# Patient Record
Sex: Male | Born: 1965
Health system: Southern US, Community
[De-identification: ages and names within clinical notes are randomized; demographics above are authoritative.]

## PROBLEM LIST (undated history)

## (undated) DIAGNOSIS — C61 Malignant neoplasm of prostate: Secondary | ICD-10-CM

## (undated) DIAGNOSIS — J4540 Moderate persistent asthma, uncomplicated: Secondary | ICD-10-CM

## (undated) HISTORY — PX: NO PAST SURGERIES: SHX2092

## (undated) HISTORY — PX: PROSTATE BIOPSY: SHX241

---

## 2005-02-12 ENCOUNTER — Emergency Department (HOSPITAL_COMMUNITY): Admission: EM | Admit: 2005-02-12 | Discharge: 2005-02-12 | Payer: Self-pay | Admitting: Emergency Medicine

## 2005-06-10 ENCOUNTER — Encounter: Admission: RE | Admit: 2005-06-10 | Discharge: 2005-06-10 | Payer: Self-pay | Admitting: Family Medicine

## 2007-02-01 IMAGING — CT CT ABDOMEN W/ CM
1 of 2 series · 15 of 32 positions shown, 19 images · IV contrast (G+ & 100 ML OMNI 300)
Comparison: None.

CLINICAL DATA: Right flank pain radiating down leg.
 ABDOMEN AND PELVIS CT WITH CONTRAST:
TECHNIQUE: Multidetector CT imaging of the abdomen and pelvis was performed following the standard protocol during bolus administration of intravenous contrast.
 Contrast:  100 cc of Omnipaque 300.

[Series 2: routine abdomen · axial · 0.67mm/px · z∈[-412,-12]mm · 15 of 87 slices shown, 19 images]
[im 4/87  soft-tissue]
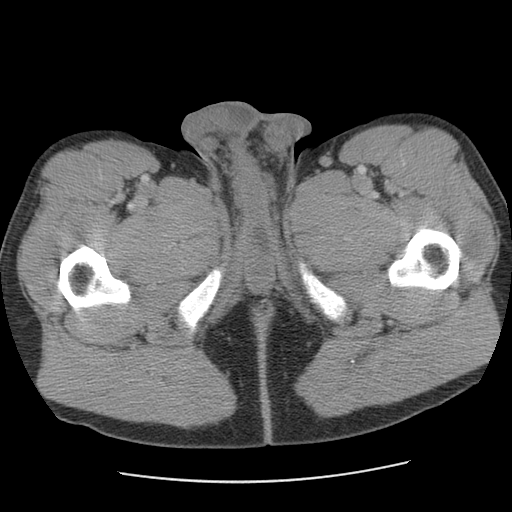
[im 4/87  bone]
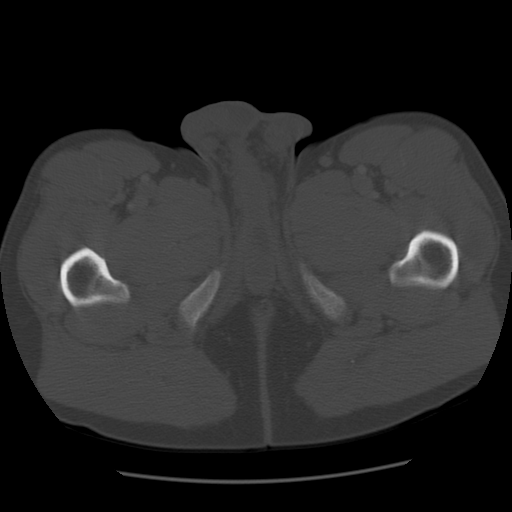
[im 12/87  soft-tissue]
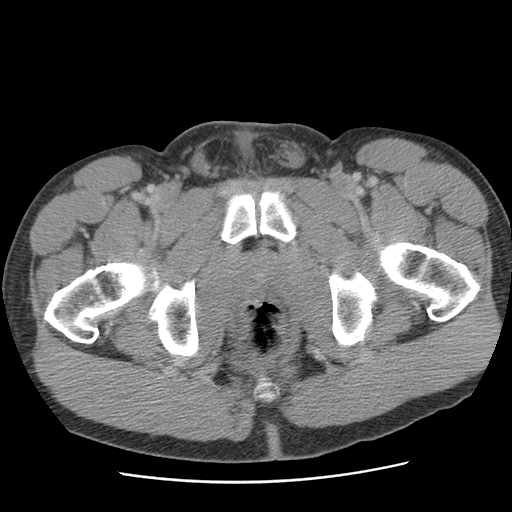
[im 19/87  soft-tissue]
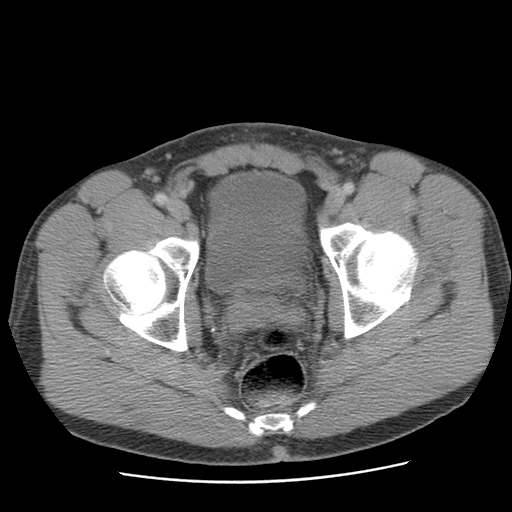
[im 23/87  soft-tissue]
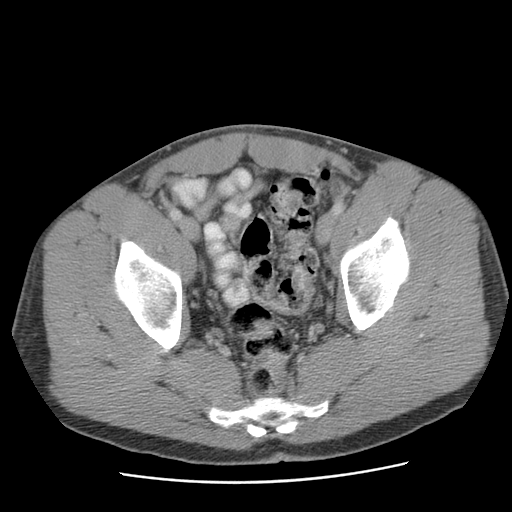
[im 30/87  soft-tissue]
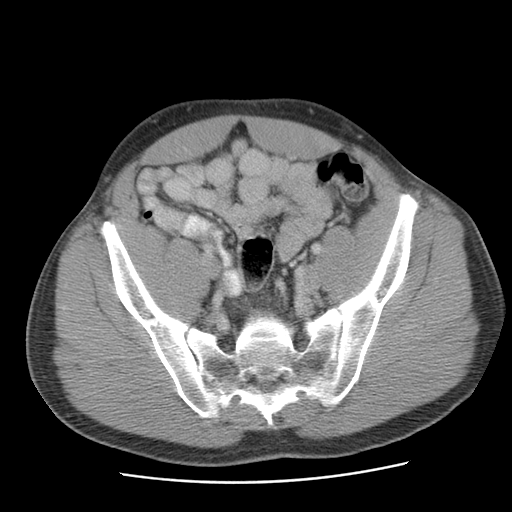
[im 38/87  soft-tissue]
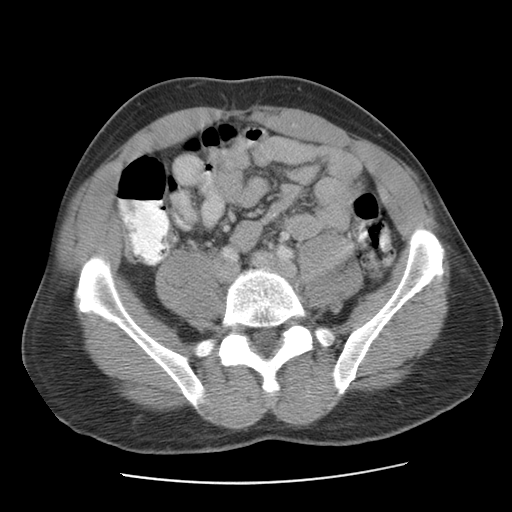
[im 45/87  soft-tissue]
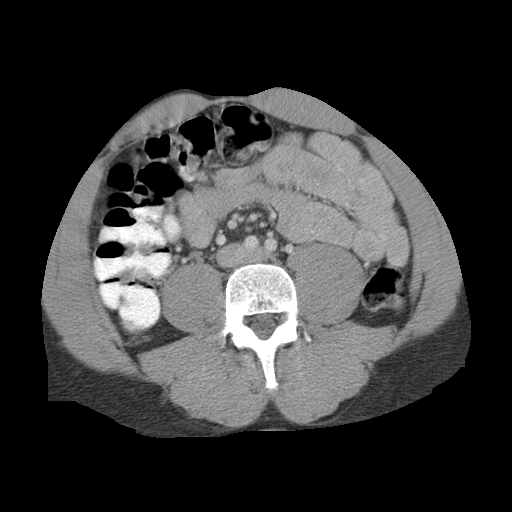
[im 49/87  soft-tissue]
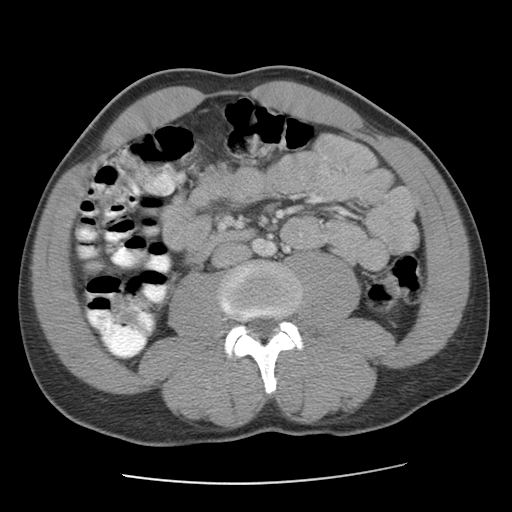
[im 57/87  soft-tissue]
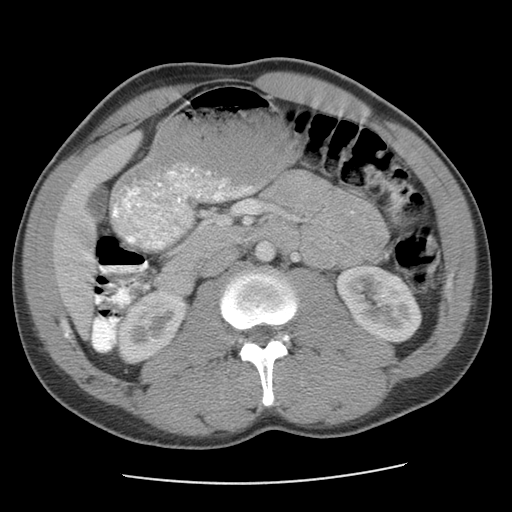
[im 57/87  bone]
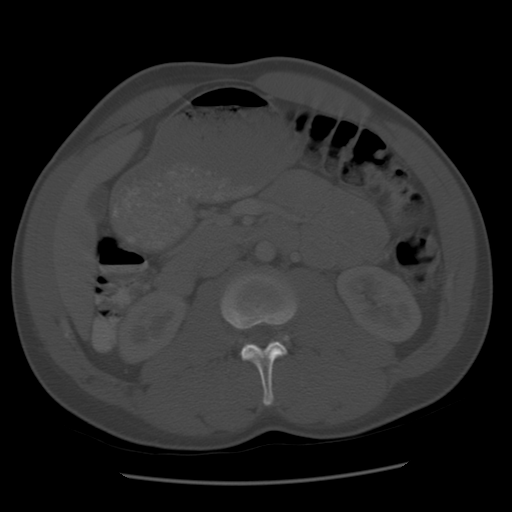
[im 64/87  soft-tissue]
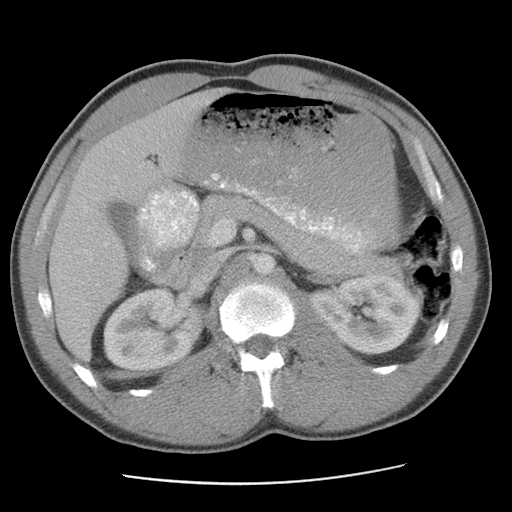
[im 68/87  soft-tissue]
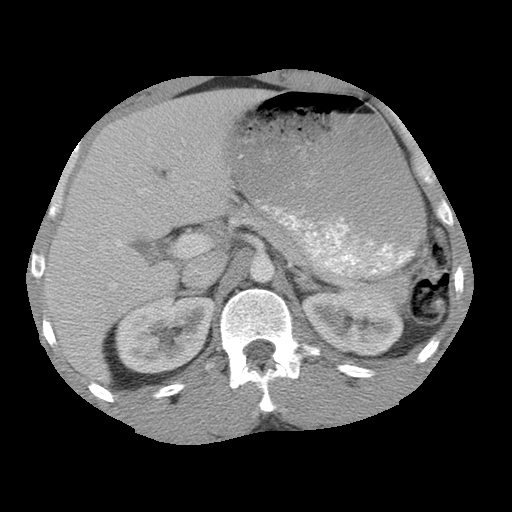
[im 72/87  lung]
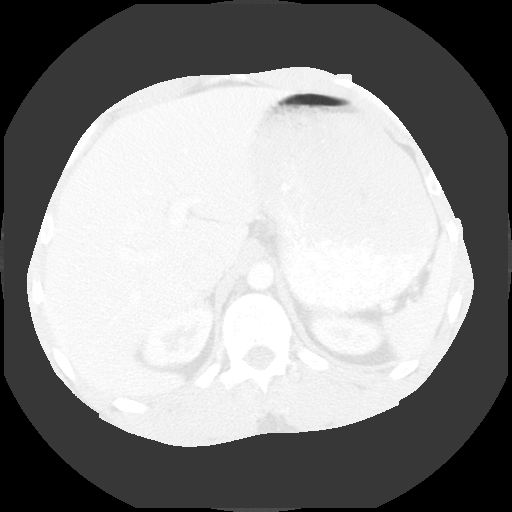
[im 75/87  soft-tissue]
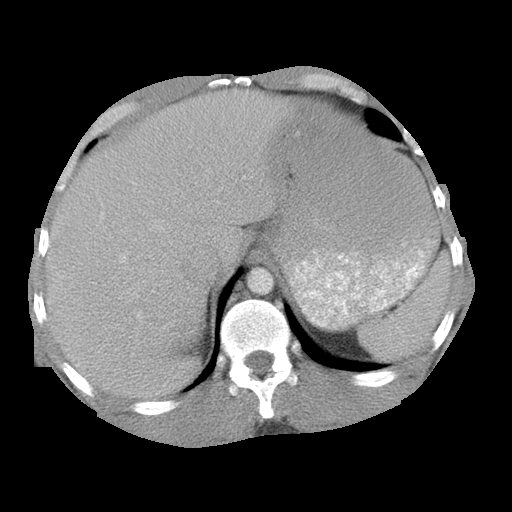
[im 75/87  lung]
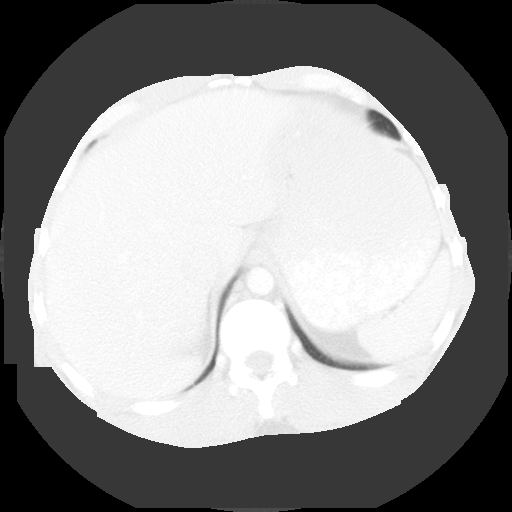
[im 79/87  lung]
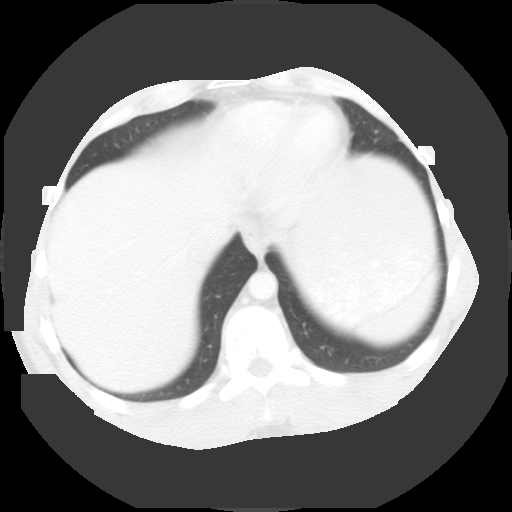
[im 83/87  soft-tissue]
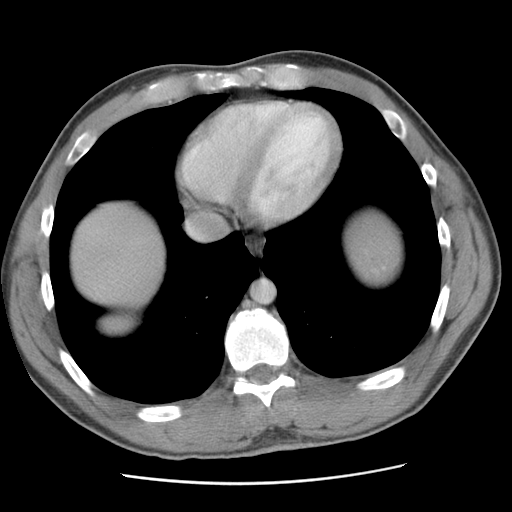
[im 83/87  lung]
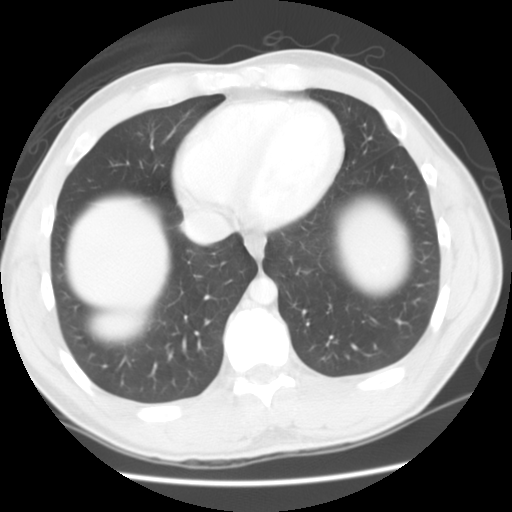

[15 of 32 positions shown; findings below may reference images not displayed]

CT ABDOMEN WITH CONTRAST:
 Lung bases are clear.  No focal lesions of the liver or spleen.  Pancreas and adrenal glands normal.  No calcified gallstones or biliary obstruction.  No adenopathy or ascites.  Prominent diaphragmatic crura simulate adenopathy of the retroperitoneum.  Early and delayed images of the kidneys show no stones, masses, or obstruction.
IMPRESSION: No acute or significant findings. 
 CT PELVIS WITH CONTRAST:
 No focal masses, adenopathy, or fluid collections.  I do not see the appendix with certainty but there are no inflammatory changes in the region of the appendix to suggest appendicitis.  Prostate within normal limits in size although there are some central prostatic calcifications.  Pelvic sidewalls and presacral space are normal.
IMPRESSION: 1.  No acute or significant findings.
 2.  The appendix is not visualized with certainty, but there are no inflammatory changes to suggest appendicitis.

## 2011-04-06 ENCOUNTER — Encounter: Payer: Self-pay | Admitting: Internal Medicine

## 2011-04-06 DIAGNOSIS — E663 Overweight: Secondary | ICD-10-CM | POA: Insufficient documentation

## 2011-04-06 DIAGNOSIS — J45909 Unspecified asthma, uncomplicated: Secondary | ICD-10-CM | POA: Insufficient documentation

## 2011-04-07 ENCOUNTER — Ambulatory Visit (INDEPENDENT_AMBULATORY_CARE_PROVIDER_SITE_OTHER): Payer: BC Managed Care – PPO | Admitting: Internal Medicine

## 2011-04-07 ENCOUNTER — Encounter: Payer: Self-pay | Admitting: Internal Medicine

## 2011-04-07 DIAGNOSIS — J45909 Unspecified asthma, uncomplicated: Secondary | ICD-10-CM

## 2011-04-07 NOTE — Assessment & Plan Note (Addendum)
DDX of  difficult airways managment all start with A and  include Adherence, Ace Inhibitors, Acid Reflux, Active Sinus Disease, Alpha 1 Antitripsin deficiency, Anxiety masquerading as Airways dz,  ABPA,  allergy(esp in young), Aspiration (esp in elderly), Adverse effects of DPI,  Active smokers, plus two Bs  = Bronchiectasis and Beta blocker use..and one C= CHF  Adherence is always the initial "prime suspect" and is a multilayered concern that requires a "trust but verify" approach in every patient - starting with knowing how to use medications, especially inhalers, correctly, keeping up with refills and understanding the fundamental difference between maintenance and prns vs those medications only taken for a very short course and then stopped and not refilled.   Needs pft's on return and keep working on hfa  The proper method of use, as well as anticipated side effects, of this metered-dose inhaler are discussed and demonstrated to the patient.  Improved to 75% p repeated teaching Allergies also a concern but avoidance of specific triggers is the best approach here   Contingencies discussed in full including contacting this office immediately if not controlling the symptoms using the rule of two's.

## 2011-04-07 NOTE — Patient Instructions (Signed)
Work on inhaler technique:  relax and gently blow all the way out then take a nice smooth deep breath back in, triggering the inhaler at same time you start breathing in.  Hold for up to 5 seconds if you can.  Rinse and gargle with water when done   If your mouth or throat starts to bother you,   I suggest you time the inhaler to your dental care and after using the inhaler(s) brush teeth and tongue with a baking soda containing toothpaste and when you rinse this out, gargle with it first to see if this helps your mouth and throat.     Symbicort 160 Take 2 puffs first thing in am and then another 2 puffs about 12 hours later.    Only use proaire if needed - the goal is to need it less than twice weekly be able to do anything you want (like 20/20 wearing glasses)  Please schedule a follow up office visit in 4 weeks, sooner if needed pft's

## 2011-04-07 NOTE — Progress Notes (Signed)
  Subjective:    Patient ID: Jordan [name], male    DOB: February 18, 1966, 45 y.o.   MRN: [id number]  HPI  50 yobm  Never smoker with asthma entire life referred by Dr Nehemiah Settle for pulmonary clinic for evaluation 04/07/2011  04/07/2011 Initial pulmonary office eval cc chronic/ recurrent sob when not using symbicort,  But even on symbicort needs proaire when mowing grass. Never while sleeping.  No cough, no previous allergy eval.  No er or hospitalizaitons.  Pt denies any significant sore throat, dysphagia, itching, sneezing,  nasal congestion or excess/ purulent secretions,  fever, chills, sweats, unintended wt loss, pleuritic or exertional cp, hempoptysis, orthopnea pnd or leg swelling.    Also denies any obvious fluctuation of symptoms with weather or environmental changes or other aggravating or alleviating factors other than mowing grass. 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  HENT: Negative for congestion, sore throat, rhinorrhea, sneezing, trouble swallowing, dental problem, voice change and postnasal drip.   Eyes: Negative for visual disturbance.  Respiratory: Positive for shortness of breath. Negative for cough and choking.   Cardiovascular: Negative for chest pain and leg swelling.  Gastrointestinal: Negative for nausea, vomiting and abdominal pain.  Genitourinary: Negative for difficulty urinating.  Musculoskeletal: Negative for arthralgias.  Skin: Negative for rash.  Psychiatric/Behavioral: Negative for behavioral problems and confusion.       Objective:   Physical Exam amb bm nad  HEENT: nl dentition, turbinates, and orophanx. Nl external ear canals without cough reflex   NECK :  without JVD/Nodes/TM/ nl carotid upstrokes bilaterally   LUNGS: no acc muscle use, clear to A and P bilaterally without cough on insp or exp  maneuvers   CV:  RRR  no s3 or murmur or increase in P2, no edema   ABD:  soft and nontender with nl excursion in the supine position. No bruits or organomegaly, bowel sounds nl  MS:  warm without deformities, calf tenderness, cyanosis or clubbing  SKIN: warm and dry without lesions    NEURO:  alert, approp, no deficits          Assessment & Plan:

## 2011-05-01 ENCOUNTER — Encounter: Payer: Self-pay | Admitting: Internal Medicine

## 2011-05-05 ENCOUNTER — Ambulatory Visit (INDEPENDENT_AMBULATORY_CARE_PROVIDER_SITE_OTHER): Payer: BC Managed Care – PPO | Admitting: Internal Medicine

## 2011-05-05 ENCOUNTER — Other Ambulatory Visit (INDEPENDENT_AMBULATORY_CARE_PROVIDER_SITE_OTHER): Payer: BC Managed Care – PPO

## 2011-05-05 ENCOUNTER — Encounter: Payer: Self-pay | Admitting: Internal Medicine

## 2011-05-05 DIAGNOSIS — J45909 Unspecified asthma, uncomplicated: Secondary | ICD-10-CM

## 2011-05-05 LAB — CBC WITH DIFFERENTIAL/PLATELET
Basophils Absolute: 0 10*3/uL (ref 0.0–0.1)
Basophils Relative: 0.3 % (ref 0.0–3.0)
Eosinophils Relative: 1.9 % (ref 0.0–5.0)
MCV: 84.5 fl (ref 78.0–100.0)
Neutro Abs: 4.7 10*3/uL (ref 1.4–7.7)
RDW: 14.8 % — ABNORMAL HIGH (ref 11.5–14.6)

## 2011-05-05 LAB — PULMONARY FUNCTION TEST

## 2011-05-05 NOTE — Patient Instructions (Addendum)
Work on inhaler technique:  relax and gently blow all the way out then take a nice smooth deep breath back in, triggering the inhaler at same time you start breathing in.  Hold for up to 5 seconds if you can.  Rinse and gargle with water when done   If your mouth or throat starts to bother you,   I suggest you time the inhaler to your dental care and after using the inhaler(s) brush teeth and tongue with a baking soda containing toothpaste and when you rinse this out, gargle with it first to see if this helps your mouth and throat.     Continue Symbicort Take 2 puffs first thing in am and then another 2 puffs about 12 hours later.    If your breathing worsens or you need to use your rescue inhaler(Proaire) more than twice weekly or wake up more than twice a month with any respiratory symptoms or require more than two rescue inhalers per year, we need to see you right away because this means we're not controlling the underlying problem (inflammation) adequately.  Rescue inhalers do not control inflammation and overuse can lead to unnecessary and costly consequences.  They can make you feel better temporarily but eventually they will quit working effectively much as sleep aids lead to more insomnia if used regularly.   Please schedule a follow up visit in 3 months but call sooner if needed   We will call you with your blood results in meantime when available

## 2011-05-05 NOTE — Progress Notes (Signed)
PFT done today. 

## 2011-05-05 NOTE — Progress Notes (Signed)
  Subjective:    Patient ID: Jordan [name], male    DOB: 1966/09/07, 45 y.o.   MRN: [id number]  HPI  34 yobm  Never smoker with asthma entire life referred by Dr Nehemiah Settle for pulmonary clinic for evaluation 04/07/2011  04/07/2011 Initial pulmonary office eval cc chronic/ recurrent sob when not using symbicort,  But even on symbicort needs proaire when mowing grass. Never while sleeping.  No cough, no previous allergy eval.  No er or hospitalizaitons. rec work hfa and start symbicort 160 2 bid .  05/05/2011 ov/Mayela Bullard cc no cough or sob but not aerobically active. Pt denies any significant sore throat, dysphagia, itching, sneezing,  nasal congestion or excess/ purulent secretions,  fever, chills, sweats, unintended wt loss, pleuritic or exertional cp, hempoptysis, orthopnea pnd or leg swelling.    Also denies any obvious fluctuation of symptoms with weather or environmental changes or other aggravating or alleviating factors.  Sleeping ok without nocturnal  or early am exac of resp c/o's or need for noct saba.    Active Problems Severe Chronic Asthma     - PFT's 05/05/2011  FEV1  1.21 (33%) ratio 34 and 37% better p B2, DLC0 76%     - HFA 75% p coaching 05/05/2011    Review of Systems     Objective:   Physical Exam Pleasant amb bm nad  Wt 215  05/05/11 HEENT mild turbinate edema.  Oropharynx no thrush or excess pnd or cobblestoning.  No JVD or cervical adenopathy. Mild accessory muscle hypertrophy. Trachea midline, nl thryroid. Chest was hyperinflated by percussion with diminished breath sounds and moderate increased exp time without wheeze. Hoover sign positive at mid inspiration. Regular rate and rhythm without murmur gallop or rub or increase P2 or edema.  Abd: no hsm, nl excursion. Ext warm without cyanosis or clubbing.         Assessment & Plan:

## 2011-05-06 ENCOUNTER — Encounter: Payer: Self-pay | Admitting: Internal Medicine

## 2011-05-06 LAB — ALLERGY PROFILE REGION II-DC, DE, MD, ~~LOC~~, VA
Allergen, D pternoyssinus,d7: <0.35 kU/L (ref <0.35)
Cladosporium Herbarum: 4.83 kU/L — ABNORMAL HIGH (ref <0.35)
Cockroach: <0.35 kU/L (ref <0.35)
Common Ragweed: 2.35 kU/L — ABNORMAL HIGH (ref <0.35)
IgE (Immunoglobulin E), Serum: 103.2 IU/mL (ref 0.0–180.0)
Johnson Grass: 0.36 kU/L — ABNORMAL HIGH (ref <0.35)
Lamb's Quarters: 0.85 kU/L — ABNORMAL HIGH (ref <0.35)
Pecan/Hickory Tree IgE: 1.43 kU/L — ABNORMAL HIGH (ref <0.35)

## 2011-05-06 NOTE — Assessment & Plan Note (Signed)
I had an extended discussion with the patient today lasting 15 to 20 minutes of a 25 minute visit on the following issues:   The proper method of use, as well as anticipated side effects, of this metered-dose inhaler are discussed and demonstrated to the patient. Improved to 75% with coaching  Severe chronic asthma ? With lots of airway remodeling so needs to think of his symbicort like a diabetic thinks of his insulin,keep up with dosing.   See instructions for specific recommendations which were reviewed directly with the patient who was given a copy with highlighter outlining the key components.

## 2011-05-07 ENCOUNTER — Telehealth: Payer: Self-pay | Admitting: Internal Medicine

## 2011-05-07 NOTE — Progress Notes (Signed)
Quick Note:  Spoke with pt and notified of results per Dr. Wert. Pt verbalized understanding and denied any questions.  ______ 

## 2011-05-07 NOTE — Telephone Encounter (Signed)
Spoke with pt and notified of results per MW.  Pt verbalized understanding.

## 2011-05-15 ENCOUNTER — Encounter: Payer: Self-pay | Admitting: Internal Medicine

## 2011-05-19 ENCOUNTER — Encounter: Payer: Self-pay | Admitting: Internal Medicine

## 2011-05-28 ENCOUNTER — Encounter: Payer: Self-pay | Admitting: Internal Medicine

## 2011-05-29 ENCOUNTER — Telehealth: Payer: Self-pay | Admitting: *Deleted

## 2011-05-29 NOTE — Telephone Encounter (Signed)
Alpha 1 test was neg- ATC pt and inform, NA and no option to leave a msg, WCB.

## 2011-06-05 NOTE — Telephone Encounter (Signed)
ATC NA 

## 2011-06-12 ENCOUNTER — Encounter: Payer: Self-pay | Admitting: Internal Medicine

## 2011-06-17 NOTE — Telephone Encounter (Signed)
ATC NA, and no option given to leave a msg

## 2011-08-12 NOTE — Progress Notes (Unsigned)
  Subjective:    Patient ID: Jordan [name], male    DOB: [birth year], 45 y.o.   MRN: [id number]  HPI  55 yobm  Never smoker with asthma entire life referred by Dr Nehemiah Settle for pulmonary clinic for evaluation 04/07/2011  04/07/2011 Initial pulmonary office eval cc chronic/ recurrent sob when not using symbicort,  But even on symbicort needs proaire when mowing grass. Never while sleeping.  No cough, no previous allergy eval.  No er or hospitalizaitons. rec work hfa and start symbicort 160 2 bid .  05/05/2011 ov/Sheila Ocasio cc no cough or sob but not aerobically active. rec Work on inhaler technique:    Continue Symbicort Take 2 puffs first thing in am and then another 2 puffs about 12 hours later.         Active Problems Severe Chronic Asthma     - PFT's 05/05/2011  FEV1  1.21 (33%) ratio 34 and 37% better p B2, DLC0 76%     - HFA 75% p coaching 05/05/2011    Review of Systems     Objective:   Physical Exam Pleasant amb bm nad   Wt 215  05/05/11 > HEENT mild turbinate edema.  Oropharynx no thrush or excess pnd or cobblestoning.  No JVD or cervical adenopathy. Mild accessory muscle hypertrophy. Trachea midline, nl thryroid. Chest was hyperinflated by percussion with diminished breath sounds and moderate increased exp time without wheeze. Hoover sign positive at mid inspiration. Regular rate and rhythm without murmur gallop or rub or increase P2 or edema.  Abd: no hsm, nl excursion. Ext warm without cyanosis or clubbing.         Assessment & Plan:

## 2011-08-13 ENCOUNTER — Ambulatory Visit: Payer: BC Managed Care – PPO | Admitting: Internal Medicine

## 2016-04-01 DIAGNOSIS — Z0000 Encounter for general adult medical examination without abnormal findings: Secondary | ICD-10-CM | POA: Diagnosis not present

## 2016-04-01 DIAGNOSIS — Z125 Encounter for screening for malignant neoplasm of prostate: Secondary | ICD-10-CM | POA: Diagnosis not present

## 2017-04-21 DIAGNOSIS — R972 Elevated prostate specific antigen [PSA]: Secondary | ICD-10-CM | POA: Diagnosis not present

## 2017-04-21 DIAGNOSIS — Z0000 Encounter for general adult medical examination without abnormal findings: Secondary | ICD-10-CM | POA: Diagnosis not present

## 2017-06-21 DIAGNOSIS — R972 Elevated prostate specific antigen [PSA]: Secondary | ICD-10-CM | POA: Diagnosis not present

## 2017-08-16 DIAGNOSIS — R972 Elevated prostate specific antigen [PSA]: Secondary | ICD-10-CM | POA: Diagnosis not present

## 2017-09-02 ENCOUNTER — Encounter: Payer: Self-pay | Admitting: Radiation Oncology

## 2017-09-02 DIAGNOSIS — C61 Malignant neoplasm of prostate: Secondary | ICD-10-CM | POA: Diagnosis not present

## 2017-09-08 ENCOUNTER — Encounter: Payer: Self-pay | Admitting: Radiation Oncology

## 2017-09-08 DIAGNOSIS — C61 Malignant neoplasm of prostate: Secondary | ICD-10-CM | POA: Insufficient documentation

## 2017-09-09 ENCOUNTER — Ambulatory Visit
Admission: RE | Admit: 2017-09-09 | Discharge: 2017-09-09 | Disposition: A | Discharge status: Home / Self Care | Payer: BLUE CROSS/BLUE SHIELD | Source: Ambulatory Visit | Attending: Radiation Oncology | Admitting: Radiation Oncology

## 2017-09-09 ENCOUNTER — Encounter: Payer: Self-pay | Admitting: Medical Oncology

## 2017-09-09 ENCOUNTER — Encounter: Payer: Self-pay | Admitting: Radiation Oncology

## 2017-09-09 DIAGNOSIS — C61 Malignant neoplasm of prostate: Secondary | ICD-10-CM | POA: Insufficient documentation

## 2017-09-09 DIAGNOSIS — Z8042 Family history of malignant neoplasm of prostate: Secondary | ICD-10-CM | POA: Diagnosis not present

## 2017-09-09 DIAGNOSIS — R972 Elevated prostate specific antigen [PSA]: Secondary | ICD-10-CM | POA: Diagnosis not present

## 2017-09-09 HISTORY — DX: Malignant neoplasm of prostate: C61

## 2017-09-09 NOTE — Progress Notes (Signed)
GU Location of Tumor / Histology: prostatic adenocarcinoma  If Prostate Cancer, Gleason Score is (3 + 4) and PSA is (6.03). Prostate volume: 38 cc.  Jordan [name] with extensive family hx of prostate cancer. Referred for years for elevated PSA but did not keep appointments.   Biopsies of prostate (if applicable) revealed:    Past/Anticipated interventions by urology, if any: prostate biopsy, referral to radiation oncology to discuss brachytherapy  Past/Anticipated interventions by medical oncology, if any: no  Weight changes, if any: no  Bowel/Bladder complaints, if any: No. LUTS. No ED.   Nausea/Vomiting, if any: no  Pain issues, if any:  no  SAFETY ISSUES:  Prior radiation? no  Pacemaker/ICD? no  Possible current pregnancy? no  Is the patient on methotrexate? no  Current Complaints / other details:  51 year old male. Single. No children. 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

## 2017-09-09 NOTE — Progress Notes (Signed)
Radiation Oncology         [phone] ________________________________  Initial outpatient Consultation  Name: Jordan [name] MRN: [id number]  Date: 09/09/2017  DOB: [birth year]  JO:ACZYSA, [name], MD  Alexis Frock, MD   REFERRING PHYSICIAN: Alexis Frock, MD  DIAGNOSIS: 51 y.o. gentleman with Stage T1c adenocarcinoma of the prostate with Gleason Score of 3+4, and PSA of 6.03.    ICD-10-CM   1. Malignant neoplasm of prostate ([place]) C61     HISTORY OF PRESENT ILLNESS: Jordan [name] is a 51 y.o. male with a diagnosis of prostate cancer. He has a history of elevated PSA dating back to 2016 and has previously been referred for evaluation in Urology but never followed through with those appointments.  More recently, he was noted to have an elevated PSA of 6.03 by his primary care physician, Dr. Seward Carol.  Accordingly, he was referred for evaluation in urology by Dr. Tresa Moore on 06/21/17,  digital rectal examination was performed at that time revealing a smooth gland without nodules.  The patient proceeded to transrectal ultrasound with 12 biopsies of the prostate on 08/16/17.  The prostate volume measured 38 cc.  Out of 12 core biopsies,3 were positive.  The maximum Gleason score was 3+4, and this was seen in the left apex.  There was also Gleason 6 disease seen in the left mid gland and right mid-lateral. He has a strong family history of prostate cancer in his father and brother.  PSA trend: 2.60 02/2014 5.07 02/2015 6.03 03/2016  The patient reviewed the biopsy results with his urologist and he has kindly been referred today for discussion of potential radiation treatment options.  PREVIOUS RADIATION THERAPY: No  PAST MEDICAL HISTORY:  Past Medical History:  Diagnosis Date  . Asthma   . Overweight(278.02)   . Prostate cancer ([place])       PAST SURGICAL HISTORY: Past Surgical History:  Procedure Laterality Date  . PROSTATE BIOPSY      FAMILY HISTORY:  Family History    Problem Relation Age of Onset  . Asthma Maternal Aunt   . Cancer Mother        breast  . Cancer Father        prostate  . Cancer Sister        unknown/"cancer of blood"  . Cancer Brother        prostate  . Cancer Maternal Uncle        prostate  . Cancer Paternal Aunt        unknown  . Cancer Cousin        lung    SOCIAL HISTORY:  Social History   Social History  . Marital status: Single    Spouse name: N/A  . Number of children: 0  . Years of education: N/A   Occupational History  . dish washer    Social History Main Topics  . Smoking status: Never Smoker  . Smokeless tobacco: Never Used  . Alcohol use No  . Drug use: No  . Sexual activity: Yes   Other Topics Concern  . Not on file   Social History Narrative  . No narrative on file    ALLERGIES: Patient has no known allergies.  MEDICATIONS:  Current Outpatient Prescriptions  Medication Sig Dispense Refill  . albuterol (PROAIR HFA) 108 (90 BASE) MCG/ACT inhaler Inhale 2 puffs into the lungs every 6 (six) hours as needed.      . budesonide-formoterol (SYMBICORT) 160-4.5 MCG/ACT inhaler Inhale 2  puffs into the lungs 2 (two) times daily.       No current facility-administered medications for this encounter.     REVIEW OF SYSTEMS:  On review of systems, the patient reports that he is doing well overall. He denies any chest pain, shortness of breath, cough, fevers, chills, night sweats, unintended weight changes. He denies any bowel disturbances, and denies abdominal pain, nausea or vomiting. He denies any new musculoskeletal or joint aches or pains. His IPSS was 0, indicating no urinary symptoms. He is able to complete sexual activity with all attempts. A complete review of systems is obtained and is otherwise negative.    PHYSICAL EXAM:  Wt Readings from Last 3 Encounters:  09/09/17 174 lb 12.8 oz (79.3 kg)  05/05/11 215 lb (97.5 kg)  04/07/11 217 lb (98.4 kg)   Temp Readings from Last 3 Encounters:   09/09/17 98 F (36.7 C) (Oral)  05/05/11 97.7 F (36.5 C) (Oral)  04/07/11 97.5 F (36.4 C) (Oral)   BP Readings from Last 3 Encounters:  09/09/17 130/83  05/05/11 116/74  04/07/11 110/74   Pulse Readings from Last 3 Encounters:  09/09/17 73  05/05/11 75  04/07/11 79   Pain Assessment Pain Score: 0-No pain/10  In general this is a well appearing african american gentleman in no acute distress. He is alert and oriented x4 and appropriate throughout the examination. HEENT reveals that the patient is normocephalic, atraumatic. EOMs are intact. PERRLA. Skin is intact without any evidence of gross lesions. Cardiovascular exam reveals a regular rate and rhythm, no clicks rubs or murmurs are auscultated. Chest is clear to auscultation bilaterally. Lymphatic assessment is performed and does not reveal any adenopathy in the cervical, supraclavicular, axillary, or inguinal chains. Abdomen has active bowel sounds in all quadrants and is intact. The abdomen is soft, non tender, non distended. Lower extremities are negative for pretibial pitting edema, deep calf tenderness, cyanosis or clubbing.   KPS = 100  100 - Normal; no complaints; no evidence of disease. 90   - Able to carry on normal activity; minor signs or symptoms of disease. 80   - Normal activity with effort; some signs or symptoms of disease. 11   - Cares for self; unable to carry on normal activity or to do active work. 60   - Requires occasional assistance, but is able to care for most of his personal needs. 50   - Requires considerable assistance and frequent medical care. 39   - Disabled; requires special care and assistance. 48   - Severely disabled; hospital admission is indicated although death not imminent. 79   - Very sick; hospital admission necessary; active supportive treatment necessary. 10   - Moribund; fatal processes progressing rapidly. 0     - Dead  Karnofsky DA, Abelmann Dix, Craver LS and Burchenal [employer] [phone])  The use of the nitrogen mustards in the palliative treatment of carcinoma: with particular reference to bronchogenic carcinoma Cancer 1 [id number]  LABORATORY DATA:  Lab Results  Component Value Date   WBC 9.1 05/05/2011   HGB 15.5 05/05/2011   HCT 45.6 05/05/2011   MCV 84.5 05/05/2011   PLT 280.0 05/05/2011   No results found for: NA, K, CL, CO2 No results found for: ALT, AST, GGT, ALKPHOS, BILITOT   RADIOGRAPHY: No results found.    IMPRESSION/PLAN: 1. 51 y.o. gentleman with Stage T1c adenocarcinoma of the prostate with Gleason Score of 3+4, and PSA of 6.03. We discussed the patient's workup and  outlined the nature of prostate cancer in this setting. The patient's T stage, Gleason's score, and PSA put him into the intermediate risk group. Accordingly, he is eligible for a variety of potential treatment options including brachytherapy, 8 weeks of external radiation or 5 weeks of external radiation followed by a brachytherapy boost. We discussed the available radiation techniques, and focused on the details and logistics and delivery. We discussed and outlined the risks, benefits, short and long-term effects associated with radiotherapy and compared and contrasted these with prostatectomy. We discussed the role of SpaceOAR in reducing rectal toxicity from radiotherapy. The patient reports that he is not interested in prostatectomy due to the fact that he would be out of work for an extended period of time.  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  At the conclusion of our conversation, the patient is interested in moving forward with brachytherapy and use of SpaceOAR to reduce rectal toxicity from radiotherapy.  We will share our discussion with Dr. Tresa Moore and move forward with scheduling his [facility] planning appointment and brachytherapy procedure in the near future.  The patient met briefly with Romie Jumper in our office who will be  working closely with him to coordinate OR scheduling and pre and post procedure appointments.  We will make every attempt to coordinate the [place] appointment and procedure dates with his work schedule to occur on a day that his not scheduled to work. We will contact the pharmaceutical rep to ensure that [place] is available at the time of procedure.  He will have a prostate MRI following his post-seed CT SIM to confirm appropriate distribution of the [place].   2. Genetic counseling.  Given the patient's extensive family history of cancer, he will be referred to genetic counseling to determine if he has a genetic predisposition to other cancers.   We enjoyed meeting with him today, and will look forward to participating in the care of this very nice gentleman.     Nicholos Johns, PA-C    Tyler Pita, MD  [facility] Direct Dial: [phone]  Fax: [phone] [email or web address]  Skype  LinkedIn    Page Me      This document serves as a record of services personally performed by Tyler Pita, MD and Freeman Caldron, PA-C. It was created on their behalf by Bethann Humble, a trained medical scribe. The creation of this record is based on the scribe's personal observations and the provider's statements to them. This document has been checked and approved by the attending provider.

## 2017-09-09 NOTE — Progress Notes (Signed)
See progress note under physician encounter. 

## 2017-09-09 NOTE — Progress Notes (Signed)
Introduced myself to Jordan [name] as the prostate nurse navigator and my role. He  Is very interested in brachytherapy due to being away from work. He informed me his father and a brother have prostate cancer. He does not have children but he is interested in genetic counseling. I will continue to follow and asked him to call me with questions or concerns.

## 2017-09-22 ENCOUNTER — Telehealth: Payer: Self-pay | Admitting: *Deleted

## 2017-09-22 NOTE — Telephone Encounter (Signed)
CALLED PATIENT TO INFORM OF IMPLANT AND SPACE OAR ON 11-25-17 @ 7:30 AM, SPOKE WITH PATIENT AND HE IS AWARE OF THIS PROCEDURE

## 2017-09-22 NOTE — Telephone Encounter (Signed)
XXXX 

## 2017-10-06 ENCOUNTER — Telehealth: Payer: Self-pay | Admitting: *Deleted

## 2017-10-06 NOTE — Telephone Encounter (Signed)
CALLED PATIENT TO REMIND OF PRE-SEED APPTS. FOR 10-07-17, NO ANSWER WILL CALL LATER.

## 2017-10-07 ENCOUNTER — Ambulatory Visit
Admission: RE | Admit: 2017-10-07 | Discharge: 2017-10-07 | Disposition: A | Discharge status: Home / Self Care | Payer: BLUE CROSS/BLUE SHIELD | Source: Ambulatory Visit | Attending: Radiation Oncology | Admitting: Radiation Oncology

## 2017-10-07 ENCOUNTER — Ambulatory Visit
Admission: RE | Admit: 2017-10-07 | Payer: BLUE CROSS/BLUE SHIELD | Source: Ambulatory Visit | Admitting: Radiation Oncology

## 2017-10-07 DIAGNOSIS — C61 Malignant neoplasm of prostate: Secondary | ICD-10-CM

## 2017-10-07 NOTE — Progress Notes (Signed)
  Radiation Oncology         [phone] ________________________________  Name: Jordan [name] MRN: [id number]  Date: 10/07/2017  DOB: 01/15/1966  SIMULATION AND TREATMENT PLANNING NOTE PUBIC ARCH STUDY  SR:PRXYVO, [name], MD  Alexis Frock, MD  DIAGNOSIS: 51 y.o. gentleman with Stage T1c adenocarcinoma of the prostate with Gleason Score of 3+4, and PSA of 6.03.    ICD-10-CM   1. Malignant neoplasm of prostate ([place]) C61     COMPLEX SIMULATION:  The patient presented today for evaluation for possible prostate seed implant. He was brought to the radiation planning suite and placed supine on the CT couch. A 3-dimensional image study set was obtained in upload to the planning computer. There, on each axial slice, I contoured the prostate gland. Then, using three-dimensional radiation planning tools I reconstructed the prostate in view of the structures from the transperineal needle pathway to assess for possible pubic arch interference. In doing so, I did not appreciate any pubic arch interference. Also, the patient's prostate volume was estimated based on the drawn structure. The volume was 41 cc.  Given the pubic arch appearance and prostate volume, patient remains a good candidate to proceed with prostate seed implant. Today, he freely provided informed written consent to proceed.    PLAN: The patient will undergo prostate seed implant.   ________________________________  Sheral Apley. Tammi Klippel, M.D.

## 2017-10-13 ENCOUNTER — Telehealth: Payer: Self-pay | Admitting: *Deleted

## 2017-10-13 NOTE — Telephone Encounter (Signed)
Called patient to remind of chest x-ray and EKG for 10-14-17, spoke with patient and he is aware of these appts.

## 2017-10-14 ENCOUNTER — Encounter (HOSPITAL_BASED_OUTPATIENT_CLINIC_OR_DEPARTMENT_OTHER)
Admission: RE | Admit: 2017-10-14 | Discharge: 2017-10-14 | Disposition: A | Discharge status: Home / Self Care | Payer: BLUE CROSS/BLUE SHIELD | Source: Ambulatory Visit | Attending: Urology | Admitting: Urology

## 2017-10-14 ENCOUNTER — Other Ambulatory Visit: Payer: Self-pay

## 2017-10-14 ENCOUNTER — Ambulatory Visit (HOSPITAL_BASED_OUTPATIENT_CLINIC_OR_DEPARTMENT_OTHER)
Admission: RE | Admit: 2017-10-14 | Discharge: 2017-10-14 | Disposition: A | Discharge status: Home / Self Care | Payer: BLUE CROSS/BLUE SHIELD | Source: Ambulatory Visit | Attending: Urology | Admitting: Urology

## 2017-10-14 DIAGNOSIS — R9431 Abnormal electrocardiogram [ECG] [EKG]: Secondary | ICD-10-CM | POA: Insufficient documentation

## 2017-10-14 DIAGNOSIS — C61 Malignant neoplasm of prostate: Secondary | ICD-10-CM | POA: Insufficient documentation

## 2017-10-14 DIAGNOSIS — Z01810 Encounter for preprocedural cardiovascular examination: Secondary | ICD-10-CM | POA: Diagnosis not present

## 2017-10-14 DIAGNOSIS — Z01812 Encounter for preprocedural laboratory examination: Secondary | ICD-10-CM | POA: Diagnosis not present

## 2017-10-14 DIAGNOSIS — J45909 Unspecified asthma, uncomplicated: Secondary | ICD-10-CM | POA: Diagnosis not present

## 2017-10-14 DIAGNOSIS — Z01818 Encounter for other preprocedural examination: Secondary | ICD-10-CM | POA: Diagnosis not present

## 2017-10-14 DIAGNOSIS — E663 Overweight: Secondary | ICD-10-CM | POA: Insufficient documentation

## 2017-11-04 DIAGNOSIS — T1511XA Foreign body in conjunctival sac, right eye, initial encounter: Secondary | ICD-10-CM | POA: Diagnosis not present

## 2017-11-15 ENCOUNTER — Other Ambulatory Visit: Payer: Self-pay | Admitting: Urology

## 2017-11-15 ENCOUNTER — Encounter (HOSPITAL_BASED_OUTPATIENT_CLINIC_OR_DEPARTMENT_OTHER): Payer: Self-pay | Admitting: *Deleted

## 2017-11-17 ENCOUNTER — Telehealth: Payer: Self-pay | Admitting: *Deleted

## 2017-11-17 ENCOUNTER — Other Ambulatory Visit: Payer: Self-pay | Admitting: Urology

## 2017-11-17 DIAGNOSIS — C61 Malignant neoplasm of prostate: Secondary | ICD-10-CM

## 2017-11-17 NOTE — Telephone Encounter (Signed)
Called patient to remind of lab for 11-22-17 - arrival time - 10:45 am @ [facility] Admitting, spoke with patient and he is aware of this appt.

## 2017-11-22 ENCOUNTER — Encounter (HOSPITAL_COMMUNITY)
Admission: RE | Admit: 2017-11-22 | Discharge: 2017-11-22 | Disposition: A | Discharge status: Home / Self Care | Payer: BLUE CROSS/BLUE SHIELD | Source: Ambulatory Visit | Attending: Urology | Admitting: Urology

## 2017-11-22 ENCOUNTER — Other Ambulatory Visit: Payer: Self-pay

## 2017-11-22 ENCOUNTER — Encounter (HOSPITAL_BASED_OUTPATIENT_CLINIC_OR_DEPARTMENT_OTHER): Payer: Self-pay | Admitting: *Deleted

## 2017-11-22 ENCOUNTER — Encounter (HOSPITAL_COMMUNITY): Payer: Self-pay

## 2017-11-22 DIAGNOSIS — Z801 Family history of malignant neoplasm of trachea, bronchus and lung: Secondary | ICD-10-CM | POA: Diagnosis not present

## 2017-11-22 DIAGNOSIS — Z9889 Other specified postprocedural states: Secondary | ICD-10-CM | POA: Diagnosis not present

## 2017-11-22 DIAGNOSIS — Z803 Family history of malignant neoplasm of breast: Secondary | ICD-10-CM | POA: Diagnosis not present

## 2017-11-22 DIAGNOSIS — Z7951 Long term (current) use of inhaled steroids: Secondary | ICD-10-CM | POA: Diagnosis not present

## 2017-11-22 DIAGNOSIS — Z01812 Encounter for preprocedural laboratory examination: Secondary | ICD-10-CM

## 2017-11-22 DIAGNOSIS — J4540 Moderate persistent asthma, uncomplicated: Secondary | ICD-10-CM | POA: Diagnosis not present

## 2017-11-22 DIAGNOSIS — Z8042 Family history of malignant neoplasm of prostate: Secondary | ICD-10-CM | POA: Diagnosis not present

## 2017-11-22 DIAGNOSIS — C61 Malignant neoplasm of prostate: Secondary | ICD-10-CM | POA: Diagnosis not present

## 2017-11-22 DIAGNOSIS — Z825 Family history of asthma and other chronic lower respiratory diseases: Secondary | ICD-10-CM | POA: Diagnosis not present

## 2017-11-22 DIAGNOSIS — Z79899 Other long term (current) drug therapy: Secondary | ICD-10-CM | POA: Diagnosis not present

## 2017-11-22 DIAGNOSIS — Z87891 Personal history of nicotine dependence: Secondary | ICD-10-CM | POA: Diagnosis not present

## 2017-11-22 LAB — CBC
HEMATOCRIT: 45.6 % (ref 39.0–52.0)
Hemoglobin: 15.2 g/dL (ref 13.0–17.0)
MCH: 27.8 pg (ref 26.0–34.0)
MCHC: 33.3 g/dL (ref 30.0–36.0)
MCV: 83.5 fL (ref 78.0–100.0)
PLATELETS: 338 10*3/uL (ref 150–400)
RBC: 5.46 MIL/uL (ref 4.22–5.81)
RDW: 14.9 % (ref 11.5–15.5)
WBC: 9.2 10*3/uL (ref 4.0–10.5)

## 2017-11-22 LAB — COMPREHENSIVE METABOLIC PANEL
ALK PHOS: 47 U/L (ref 38–126)
ALT: 12 U/L — AB (ref 17–63)
AST: 16 U/L (ref 15–41)
Albumin: 3.9 g/dL (ref 3.5–5.0)
Anion gap: 6 (ref 5–15)
BILIRUBIN TOTAL: 0.4 mg/dL (ref 0.3–1.2)
BUN: 18 mg/dL (ref 6–20)
CALCIUM: 9.2 mg/dL (ref 8.9–10.3)
CHLORIDE: 106 mmol/L (ref 101–111)
CO2: 25 mmol/L (ref 22–32)
CREATININE: 1.43 mg/dL — AB (ref 0.61–1.24)
GFR, EST NON AFRICAN AMERICAN: 55 mL/min — AB (ref >60)
Glucose, Bld: 87 mg/dL (ref 65–99)
Potassium: 4.3 mmol/L (ref 3.5–5.1)
Sodium: 137 mmol/L (ref 135–145)
TOTAL PROTEIN: 6.2 g/dL — AB (ref 6.5–8.1)

## 2017-11-22 LAB — PROTIME-INR
INR: 0.95
PROTHROMBIN TIME: 12.6 s (ref 11.4–15.2)

## 2017-11-22 LAB — APTT: aPTT: 26 seconds (ref 24–36)

## 2017-11-22 NOTE — Progress Notes (Signed)
SPOKE W/ PT VIA PHONE.  NPO AFTER MN.  ARRIVE AT 0530.  CURRENT LAB RESULTS, CXR, AND EKG IN CHART AND Epic.  WILL DO SYMBICORT INHALER AM DOS W/ SIPS OF WATER AND DO FLEET ENEMA.  WILL BRING RESCUE INHALER.  PT VERBALIZED UNDERSTANDING , AFTER HE STATED HE DRIVING HIMSELF, THAT HE HAS TO HAVE A RESPONSIBLE PERSON 51 YEARS OLD OR OLDER TO DRIVE HIM AT DISCHARGE AND ANESTHESIA ADVICE SOMEONE TO BE WITH HIM THE NEXT  24 HOURS .

## 2017-11-23 ENCOUNTER — Other Ambulatory Visit: Payer: Self-pay | Admitting: Urology

## 2017-11-24 ENCOUNTER — Telehealth: Payer: Self-pay | Admitting: *Deleted

## 2017-11-24 NOTE — Telephone Encounter (Signed)
Called patient to remind of implant for 11-25-17, spoke with patient and he is aware of this procedure

## 2017-11-24 NOTE — Anesthesia Preprocedure Evaluation (Addendum)
Anesthesia Evaluation  Patient identified by MRN, date of birth, ID band Patient awake    Reviewed: Allergy & Precautions, NPO status , Patient's Chart, lab work & pertinent test results  Airway Mallampati: II  TM Distance: >3 FB Neck ROM: Full    Dental  (+) Dental Advisory Given, Missing,    Pulmonary asthma , former smoker,    Pulmonary exam normal breath sounds clear to auscultation       Cardiovascular Exercise Tolerance: Good (-) Past MI negative cardio ROS Normal cardiovascular exam Rhythm:Regular Rate:Normal     Neuro/Psych negative neurological ROS  negative psych ROS   GI/Hepatic negative GI ROS, Neg liver ROS, neg GERD  ,  Endo/Other  negative endocrine ROSneg diabetes  Renal/GU negative Renal ROS   Prostate cancer    Musculoskeletal negative musculoskeletal ROS (+)   Abdominal   Peds  Hematology negative hematology ROS (+)   Anesthesia Other Findings   Reproductive/Obstetrics                          Anesthesia Physical Anesthesia Plan  ASA: II  Anesthesia Plan: General   Post-op Pain Management:    Induction: Intravenous  PONV Risk Score and Plan: 3 and Treatment may vary due to age or medical condition, Ondansetron, Dexamethasone and Midazolam  Airway Management Planned: LMA  Additional Equipment: None  Intra-op Plan:   Post-operative Plan: Extubation in OR  Informed Consent: I have reviewed the patients History and Physical, chart, labs and discussed the procedure including the risks, benefits and alternatives for the proposed anesthesia with the patient or authorized representative who has indicated his/her understanding and acceptance.   Dental advisory given  Plan Discussed with: CRNA  Anesthesia Plan Comments:        Anesthesia Quick Evaluation

## 2017-11-25 ENCOUNTER — Encounter (HOSPITAL_BASED_OUTPATIENT_CLINIC_OR_DEPARTMENT_OTHER): Payer: Self-pay | Admitting: *Deleted

## 2017-11-25 ENCOUNTER — Ambulatory Visit (HOSPITAL_BASED_OUTPATIENT_CLINIC_OR_DEPARTMENT_OTHER): Payer: BLUE CROSS/BLUE SHIELD | Admitting: Anesthesiology

## 2017-11-25 ENCOUNTER — Ambulatory Visit (HOSPITAL_COMMUNITY): Payer: BLUE CROSS/BLUE SHIELD

## 2017-11-25 ENCOUNTER — Encounter (HOSPITAL_BASED_OUTPATIENT_CLINIC_OR_DEPARTMENT_OTHER)
Admission: RE | Disposition: A | Discharge status: Home / Self Care | Payer: Self-pay | Source: Ambulatory Visit | Attending: Urology

## 2017-11-25 ENCOUNTER — Other Ambulatory Visit: Payer: Self-pay

## 2017-11-25 ENCOUNTER — Ambulatory Visit (HOSPITAL_BASED_OUTPATIENT_CLINIC_OR_DEPARTMENT_OTHER)
Admission: RE | Admit: 2017-11-25 | Discharge: 2017-11-25 | Disposition: A | Discharge status: Home / Self Care | Payer: BLUE CROSS/BLUE SHIELD | Source: Ambulatory Visit | Attending: Urology | Admitting: Urology

## 2017-11-25 DIAGNOSIS — Z825 Family history of asthma and other chronic lower respiratory diseases: Secondary | ICD-10-CM | POA: Diagnosis not present

## 2017-11-25 DIAGNOSIS — Z803 Family history of malignant neoplasm of breast: Secondary | ICD-10-CM | POA: Diagnosis not present

## 2017-11-25 DIAGNOSIS — C61 Malignant neoplasm of prostate: Secondary | ICD-10-CM | POA: Diagnosis not present

## 2017-11-25 DIAGNOSIS — Z7951 Long term (current) use of inhaled steroids: Secondary | ICD-10-CM | POA: Diagnosis not present

## 2017-11-25 DIAGNOSIS — Z79899 Other long term (current) drug therapy: Secondary | ICD-10-CM | POA: Insufficient documentation

## 2017-11-25 DIAGNOSIS — J4540 Moderate persistent asthma, uncomplicated: Secondary | ICD-10-CM | POA: Insufficient documentation

## 2017-11-25 DIAGNOSIS — Z801 Family history of malignant neoplasm of trachea, bronchus and lung: Secondary | ICD-10-CM | POA: Diagnosis not present

## 2017-11-25 DIAGNOSIS — E663 Overweight: Secondary | ICD-10-CM | POA: Diagnosis not present

## 2017-11-25 DIAGNOSIS — Z87891 Personal history of nicotine dependence: Secondary | ICD-10-CM | POA: Diagnosis not present

## 2017-11-25 DIAGNOSIS — Z8042 Family history of malignant neoplasm of prostate: Secondary | ICD-10-CM | POA: Insufficient documentation

## 2017-11-25 DIAGNOSIS — J45909 Unspecified asthma, uncomplicated: Secondary | ICD-10-CM | POA: Diagnosis not present

## 2017-11-25 DIAGNOSIS — Z9889 Other specified postprocedural states: Secondary | ICD-10-CM | POA: Diagnosis not present

## 2017-11-25 DIAGNOSIS — Z01818 Encounter for other preprocedural examination: Secondary | ICD-10-CM

## 2017-11-25 HISTORY — PX: SPACE OAR INSTILLATION: SHX6769

## 2017-11-25 HISTORY — PX: RADIOACTIVE SEED IMPLANT: SHX5150

## 2017-11-25 HISTORY — DX: Moderate persistent asthma, uncomplicated: J45.40

## 2017-11-25 HISTORY — PX: CYSTOSCOPY: SHX5120

## 2017-11-25 SURGERY — INSERTION, RADIATION SOURCE, PROSTATE
Anesthesia: General

## 2017-11-25 MED ORDER — PROPOFOL 10 MG/ML IV BOLUS
INTRAVENOUS | Status: DC | PRN
  Administered 2017-11-25: 200 mg via INTRAVENOUS

## 2017-11-25 MED ORDER — MIDAZOLAM HCL 2 MG/2ML IJ SOLN
INTRAMUSCULAR | Status: AC
  Filled 2017-11-25: qty 2

## 2017-11-25 MED ORDER — TAMSULOSIN HCL 0.4 MG PO CAPS: 0.4000 mg | ORAL_CAPSULE | Freq: Every day | ORAL | 11 refills | Status: DC | PRN

## 2017-11-25 MED ORDER — ONDANSETRON HCL 4 MG/2ML IJ SOLN
INTRAMUSCULAR | Status: AC
  Filled 2017-11-25: qty 2

## 2017-11-25 MED ORDER — FENTANYL CITRATE (PF) 100 MCG/2ML IJ SOLN
INTRAMUSCULAR | Status: DC | PRN
  Administered 2017-11-25: 25 ug via INTRAVENOUS
  Administered 2017-11-25: 50 ug via INTRAVENOUS

## 2017-11-25 MED ORDER — MIDAZOLAM HCL 5 MG/5ML IJ SOLN
INTRAMUSCULAR | Status: DC | PRN
  Administered 2017-11-25: 2 mg via INTRAVENOUS

## 2017-11-25 MED ORDER — LACTATED RINGERS IV SOLN
INTRAVENOUS | Status: DC
  Administered 2017-11-25 (×2): via INTRAVENOUS
  Filled 2017-11-25: qty 1000

## 2017-11-25 MED ORDER — IOHEXOL 300 MG/ML  SOLN
INTRAMUSCULAR | Status: DC | PRN
  Administered 2017-11-25: 7 mL

## 2017-11-25 MED ORDER — PROMETHAZINE HCL 25 MG/ML IJ SOLN
6.2500 mg | INTRAMUSCULAR | Status: DC | PRN
  Filled 2017-11-25: qty 1

## 2017-11-25 MED ORDER — FENTANYL CITRATE (PF) 100 MCG/2ML IJ SOLN
25.0000 ug | INTRAMUSCULAR | Status: DC | PRN
  Filled 2017-11-25: qty 1

## 2017-11-25 MED ORDER — KETOROLAC TROMETHAMINE 30 MG/ML IJ SOLN
INTRAMUSCULAR | Status: DC | PRN
  Administered 2017-11-25: 30 mg via INTRAVENOUS

## 2017-11-25 MED ORDER — FLEET ENEMA 7-19 GM/118ML RE ENEM
1.0000 | ENEMA | Freq: Once | RECTAL | Status: DC
  Filled 2017-11-25: qty 1

## 2017-11-25 MED ORDER — SODIUM CHLORIDE 0.9 % IJ SOLN
INTRAMUSCULAR | Status: DC | PRN
  Administered 2017-11-25: 50 mL via INTRAVENOUS

## 2017-11-25 MED ORDER — DEXAMETHASONE SODIUM PHOSPHATE 10 MG/ML IJ SOLN
INTRAMUSCULAR | Status: AC
  Filled 2017-11-25: qty 1

## 2017-11-25 MED ORDER — ONDANSETRON HCL 4 MG/2ML IJ SOLN
INTRAMUSCULAR | Status: DC | PRN
  Administered 2017-11-25: 4 mg via INTRAVENOUS

## 2017-11-25 MED ORDER — PHENYLEPHRINE 40 MCG/ML (10ML) SYRINGE FOR IV PUSH (FOR BLOOD PRESSURE SUPPORT)
PREFILLED_SYRINGE | INTRAVENOUS | Status: DC | PRN
  Administered 2017-11-25 (×3): 80 ug via INTRAVENOUS

## 2017-11-25 MED ORDER — PHENYLEPHRINE 40 MCG/ML (10ML) SYRINGE FOR IV PUSH (FOR BLOOD PRESSURE SUPPORT)
PREFILLED_SYRINGE | INTRAVENOUS | Status: AC
  Filled 2017-11-25: qty 10

## 2017-11-25 MED ORDER — LIDOCAINE 2% (20 MG/ML) 5 ML SYRINGE
INTRAMUSCULAR | Status: AC
  Filled 2017-11-25: qty 5

## 2017-11-25 MED ORDER — LIDOCAINE 2% (20 MG/ML) 5 ML SYRINGE
INTRAMUSCULAR | Status: DC | PRN
  Administered 2017-11-25: 100 mg via INTRAVENOUS

## 2017-11-25 MED ORDER — DEXAMETHASONE SODIUM PHOSPHATE 10 MG/ML IJ SOLN
INTRAMUSCULAR | Status: DC | PRN
  Administered 2017-11-25: 10 mg via INTRAVENOUS

## 2017-11-25 MED ORDER — SODIUM CHLORIDE 0.9 % IR SOLN
Status: DC | PRN
  Administered 2017-11-25: 1000 mL via INTRAVESICAL

## 2017-11-25 MED ORDER — TRAMADOL HCL 50 MG PO TABS: 50.0000 mg | ORAL_TABLET | Freq: Four times a day (QID) | ORAL | 0 refills | Status: AC | PRN

## 2017-11-25 MED ORDER — GENTAMICIN SULFATE 40 MG/ML IJ SOLN
5.0000 mg/kg | Freq: Once | INTRAVENOUS | Status: DC
  Filled 2017-11-25: qty 10

## 2017-11-25 MED ORDER — PROPOFOL 10 MG/ML IV BOLUS
INTRAVENOUS | Status: AC
  Filled 2017-11-25: qty 40

## 2017-11-25 MED ORDER — GENTAMICIN SULFATE 40 MG/ML IJ SOLN
5.0000 mg/kg | Freq: Once | INTRAVENOUS | Status: AC
  Administered 2017-11-25: 400 mg via INTRAVENOUS
  Filled 2017-11-25: qty 10

## 2017-11-25 MED ORDER — DEXTROSE IN LACTATED RINGERS 5 % IV SOLN
INTRAVENOUS | Status: DC
  Filled 2017-11-25: qty 1000

## 2017-11-25 MED ORDER — FENTANYL CITRATE (PF) 100 MCG/2ML IJ SOLN
INTRAMUSCULAR | Status: AC
  Filled 2017-11-25: qty 2

## 2017-11-25 MED ORDER — KETOROLAC TROMETHAMINE 30 MG/ML IJ SOLN
INTRAMUSCULAR | Status: AC
  Filled 2017-11-25: qty 1

## 2017-11-25 SURGICAL SUPPLY — 30 items
BAG URINE DRAINAGE (UROLOGICAL SUPPLIES) ×3 IMPLANT
BLADE CLIPPER SURG (BLADE) ×2 IMPLANT
CATH COUDE FOLEY 2W 5CC 18FR (CATHETERS) ×1 IMPLANT
CATH FOLEY 2WAY SLVR  5CC 16FR (CATHETERS) ×1
CATH FOLEY 2WAY SLVR 5CC 16FR (CATHETERS) ×1 IMPLANT
CATH ROBINSON RED A/P 16FR (CATHETERS) IMPLANT
CATH ROBINSON RED A/P 20FR (CATHETERS) ×2 IMPLANT
CLOTH BEACON ORANGE TIMEOUT ST (SAFETY) ×2 IMPLANT
COVER BACK TABLE 60X90IN (DRAPES) ×2 IMPLANT
COVER MAYO STAND STRL (DRAPES) ×2 IMPLANT
DRSG TEGADERM 4X4.75 (GAUZE/BANDAGES/DRESSINGS) ×3 IMPLANT
DRSG TEGADERM 8X12 (GAUZE/BANDAGES/DRESSINGS) ×3 IMPLANT
GAUZE SPONGE 4X4 12PLY STRL LF (GAUZE/BANDAGES/DRESSINGS) ×1 IMPLANT
GLOVE BIO SURGEON STRL SZ7.5 (GLOVE) ×7 IMPLANT
GLOVE ECLIPSE 8.0 STRL XLNG CF (GLOVE) ×2 IMPLANT
GOWN STRL REUS W/TWL LRG LVL3 (GOWN DISPOSABLE) ×2 IMPLANT
GOWN STRL REUS W/TWL XL LVL3 (GOWN DISPOSABLE) ×2 IMPLANT
HOLDER FOLEY CATH W/STRAP (MISCELLANEOUS) IMPLANT
IMPL SPACEOAR SYSTEM 10ML (MISCELLANEOUS) ×1 IMPLANT
IMPLANT SPACEOAR SYSTEM 10ML (MISCELLANEOUS) ×2
IV SOD CHL 0.9% 1000ML (IV SOLUTION) ×2 IMPLANT
KIT RM TURNOVER CYSTO AR (KITS) ×2 IMPLANT
PACK CYSTO (CUSTOM PROCEDURE TRAY) ×2 IMPLANT
SURGILUBE 2OZ TUBE FLIPTOP (MISCELLANEOUS) ×2 IMPLANT
SUT BONE WAX W31G (SUTURE) ×2 IMPLANT
SYRINGE 10CC LL (SYRINGE) ×3 IMPLANT
UNDERPAD 30X30 (UNDERPADS AND DIAPERS) ×3 IMPLANT
UNDERPAD 30X30 INCONTINENT (UNDERPADS AND DIAPERS) ×4 IMPLANT
WATER STERILE IRR 500ML POUR (IV SOLUTION) ×2 IMPLANT
selectSeed I-125 ×1 IMPLANT

## 2017-11-25 NOTE — Anesthesia Procedure Notes (Signed)
Procedure Name: LMA Insertion Date/Time: 11/25/2017 7:37 AM Performed by: Bonney Aid, CRNA Pre-anesthesia Checklist: Patient identified, Emergency Drugs available, Suction available and Patient being monitored Patient Re-evaluated:Patient Re-evaluated prior to induction Oxygen Delivery Method: Circle system utilized Preoxygenation: Pre-oxygenation with 100% oxygen Induction Type: IV induction Ventilation: Mask ventilation without difficulty LMA: LMA inserted LMA Size: 5.0 Number of attempts: 1 Airway Equipment and Method: Bite block Placement Confirmation: positive ETCO2 Tube secured with: Tape Dental Injury: Teeth and Oropharynx as per pre-operative assessment

## 2017-11-25 NOTE — Brief Op Note (Signed)
11/25/2017  9:06 AM  PATIENT:  Jordan [name]  51 y.o. male  PRE-OPERATIVE DIAGNOSIS:  PROSTATE CANCER  POST-OPERATIVE DIAGNOSIS:  PROSTATE CANCER  PROCEDURE:  Procedure(s) with comments: RADIOACTIVE SEED IMPLANT/BRACHYTHERAPY IMPLANT (N/A) -  seeds implanted SPACE OAR INSTILLATION (N/A) CYSTOSCOPY (N/A) - No seeds noted in bladder  SURGEON:  Surgeon(s) and Role:    * Alexis Frock, MD - Primary    * Tyler Pita, MD - Assisting  PHYSICIAN ASSISTANT:   ASSISTANTS: none   ANESTHESIA:   general  EBL:  5 mL   BLOOD ADMINISTERED:none  DRAINS: none   LOCAL MEDICATIONS USED:  NONE  SPECIMEN:  No Specimen  DISPOSITION OF SPECIMEN:  N/A  COUNTS:  YES  TOURNIQUET:  * No tourniquets in log *  DICTATION: .Other Dictation: Dictation Number [id number]  PLAN OF CARE: Discharge to home after PACU  PATIENT DISPOSITION:  PACU - hemodynamically stable.   Delay start of Pharmacological VTE agent (>24hrs) due to surgical blood loss or risk of bleeding: not applicable

## 2017-11-25 NOTE — Transfer of Care (Signed)
Immediate Anesthesia Transfer of Care Note  Patient: Jordan [name]  Procedure(s) Performed: RADIOACTIVE SEED IMPLANT/BRACHYTHERAPY IMPLANT (N/A ) SPACE OAR INSTILLATION (N/A ) CYSTOSCOPY (N/A )  Patient Location: PACU  Anesthesia Type:General  Level of Consciousness: sedated and responds to stimulation  Airway & Oxygen Therapy: Patient Spontanous Breathing and Patient connected to nasal cannula oxygen  Post-op Assessment: Report given to RN  Post vital signs: Reviewed and stable  Last Vitals:  Vitals:   11/25/17 0530 11/25/17 0916  BP: 126/77 113/76  Pulse: 66 72  Resp: 16 12  Temp: 36.6 C 36.5 C  SpO2: 99% 100%    Last Pain:  Vitals:   11/25/17 0530  TempSrc: Oral      Patients Stated Pain Goal: 7 (41/28/20 8138)  Complications: No apparent anesthesia complications

## 2017-11-25 NOTE — H&P (Signed)
Jordan [name] is an 51 y.o. male.    Chief Complaint: Pre-Op Brachytherapy Seed Implantation  HPI:   1 - Moderate Risk Prostate Cancer - pt with extensive family h/o prostate cancer including brother and father. Referred for years for elevated PSA but did not keep appts. Finally DX 07/2017 3+4=7 in 50% of LMA and Gleason 6 disease 5% of LMM< and RLM. TRUS 60mL w/o median lobe. PSA 6.03.    PMH sig for asthma. No ischemic CV disease / blood thinners. His PCP is Seward Carol MD with [facility].    Today " Trudee Grip " is seen to proceed with prostate brachytherapy seed implantation. No interval fevers.     Past Medical History:  Diagnosis Date  . Asthma, well controlled, moderate persistent    per pt followed by pcp, dr polite, stated controlled w/ symbicort inhaler (as of 11-22-2017 denies upper airway infection in past month) (in epic documented pumoloigst , dr Melvyn Novas , [facility] 2012)  . Prostate cancer [facility]) urologist-  dr Tresa Moore  oncologist-  dr Tammi Klippel   dx 08-16-2017 (bx)-- Stage T1c, Gleason 3+4, PSA 60.3, vol 38cc-- scheduled for radiactive seed implants 11-25-2017    Past Surgical History:  Procedure Laterality Date  . NO PAST SURGERIES    . PROSTATE BIOPSY  08-16-2017  office    Family History  Problem Relation Age of Onset  . Asthma Maternal Aunt   . Cancer Mother        breast  . Cancer Father        prostate  . Cancer Sister        unknown/"cancer of blood"  . Cancer Brother        prostate  . Cancer Maternal Uncle        prostate  . Cancer Paternal Aunt        unknown  . Cancer Cousin        lung   Social History:  reports that he has quit smoking. he has never used smokeless tobacco. He reports that he drinks alcohol. He reports that he does not use drugs.  Allergies: No Known Allergies  Medications Prior to Admission  Medication Sig Dispense Refill  . albuterol (PROAIR HFA) 108 (90 BASE) MCG/ACT inhaler Inhale 2 puffs into the lungs every 6 (six) hours as  needed.     . fluticasone-salmeterol (ADVAIR HFA) 230-21 MCG/ACT inhaler Inhale 2 puffs into the lungs 2 (two) times daily.      No results found for this or any previous visit (from the past 48 hour(s)). No results found.  Review of Systems  Constitutional: Negative.  Negative for chills and fever.  HENT: Negative.   Eyes: Negative.   Respiratory: Negative.   Cardiovascular: Negative.   Gastrointestinal: Negative.   Genitourinary: Negative.   Musculoskeletal: Negative.   Skin: Negative.   Neurological: Negative.   Endo/Heme/Allergies: Negative.   Psychiatric/Behavioral: Negative.     Blood pressure 126/77, pulse 66, temperature 97.9 F (36.6 C), temperature source Oral, resp. rate 16, height 5\' 9"  (1.753 m), weight 79.6 kg (175 lb 8 oz), SpO2 99 %. Physical Exam  Constitutional: He appears well-developed.  HENT:  Head: Normocephalic.  Eyes: Pupils are equal, round, and reactive to light.  Neck: Normal range of motion.  Cardiovascular: Normal rate.  Respiratory: Effort normal.  GI: Soft.  Genitourinary:  Genitourinary Comments: No CVAT  Musculoskeletal: Normal range of motion.  Neurological: He is alert.  Skin: Skin is warm.  Psychiatric: He has a  normal mood and affect.     Assessment/Plan  1 - Moderate Risk Prostate Cancer - proceed as planned with primary brachytherpay. Risks, benefits, alternatives, expected peri-op course and need for post-op surveillance x at least 10years with periodic cancer surveillance discussed.   Alexis Frock, MD 11/25/2017, 6:09 AM

## 2017-11-25 NOTE — Discharge Instructions (Signed)
1 - You may have urinary urgency (bladder spasms) and bloody urine on / off for few weeks. This is normal.  2 - Call MD or go to ER for fever >102, severe pain / nausea / vomiting not relieved by medications, or acute change in medical status  No advil, aleve, ibuprofen, motrin until 2 pm today   Brachytherapy for Prostate Cancer, Care After Refer to this sheet in the next few weeks. These instructions provide you with information on caring for yourself after your procedure. Your health care provider may also give you more specific instructions. Your treatment has been planned according to current medical practices, but problems sometimes occur. Call your health care provider if you have any problems or questions after your procedure. What can I expect after the procedure? The area behind the scrotum will probably be tender and bruised. For a short period of time you may have:  Difficulty passing urine. You may need a catheter for a few days to a month.  Blood in the urine or semen.  A feeling of constipation because of prostate swelling.  Frequent feeling of an urgent need to urinate.  For a long period of time you may have:  Inflammation of the rectum. This happens in about 2% of people who have the procedure.  Erection problems. These vary with age and occur in about 15-40% of men.  Difficulty urinating. This is caused by scarring in the urethra.  Diarrhea.  Follow these instructions at home:  Take medicines only as directed by your health care provider.  You will probably have a catheter in your bladder for several days. You will have blood in the urine bag and should drink a lot of fluids to keep it a light red color.  Keep all follow-up visits as directed by your health care provider. If you have a catheter, it will be removed during one of these visits.  Try not to sit directly on the area behind the scrotum. A soft cushion can decrease the discomfort. Ice packs may also  be helpful for the discomfort. Do not put ice directly on the skin.  Shower and wash the area behind the scrotum gently. Do not sit in a tub.  If you have had the brachytherapy that uses the seeds, limit your close contact with children and pregnant women for 2 months because of the radiation still in the prostate. After that period of time, the levels drop off quickly. Get help right away if:  You have a fever.  You have chills.  You have shortness of breath.  You have chest pain.  You have thick blood, like tomato juice, in the urine bag.  Your catheter is blocked so urine cannot get into the bag. Your bladder area or lower abdomen may be swollen.  There is excessive bleeding from your rectum. It is normal to have a little blood mixed with your stool.  There is severe discomfort in the treated area that does not go away with pain medicine.  You have abdominal discomfort.  You have severe nausea or vomiting.  You develop any new or unusual symptoms. This information is not intended to replace advice given to you by your health care provider. Make sure you discuss any questions you have with your health care provider. Document Released: 01/16/2011 Document Revised: 05/27/2016 Document Reviewed: 06/06/2013 Elsevier Interactive Patient Education  2017 [place] Anesthesia Home Care Instructions  Activity: Get plenty of rest for the remainder of  the day. A responsible individual must stay with you for 24 hours following the procedure.  For the next 24 hours, DO NOT: -Drive a car -Paediatric nurse -Drink alcoholic beverages -Take any medication unless instructed by your physician -Make any legal decisions or sign important papers.  Meals: Start with liquid foods such as gelatin or soup. Progress to regular foods as tolerated. Avoid greasy, spicy, heavy foods. If nausea and/or vomiting occur, drink only clear liquids until the nausea and/or vomiting subsides. Call  your physician if vomiting continues.  Special Instructions/Symptoms: Your throat may feel dry or sore from the anesthesia or the breathing tube placed in your throat during surgery. If this causes discomfort, gargle with warm salt water. The discomfort should disappear within 24 hours.  If you had a scopolamine patch placed behind your ear for the management of post- operative nausea and/or vomiting:  1. The medication in the patch is effective for 72 hours, after which it should be removed.  Wrap patch in a tissue and discard in the trash. Wash hands thoroughly with soap and water. 2. You may remove the patch earlier than 72 hours if you experience unpleasant side effects which may include dry mouth, dizziness or visual disturbances. 3. Avoid touching the patch. Wash your hands with soap and water after contact with the patch.

## 2017-11-25 NOTE — Anesthesia Postprocedure Evaluation (Signed)
Anesthesia Post Note  Patient: BRADDOCK SERVELLON  Procedure(s) Performed: RADIOACTIVE SEED IMPLANT/BRACHYTHERAPY IMPLANT (N/A ) SPACE OAR INSTILLATION (N/A ) CYSTOSCOPY (N/A )     Patient location during evaluation: PACU Anesthesia Type: General Level of consciousness: awake and alert Pain management: pain level controlled Vital Signs Assessment: post-procedure vital signs reviewed and stable Respiratory status: spontaneous breathing, nonlabored ventilation and respiratory function stable Cardiovascular status: blood pressure returned to baseline and stable Postop Assessment: no apparent nausea or vomiting Anesthetic complications: no    Last Vitals:  Vitals:   11/25/17 0930 11/25/17 0945  BP: 111/74   Pulse: 69 81  Resp: 11 17  Temp:    SpO2: 100% 100%    Last Pain:  Vitals:   11/25/17 0916  TempSrc:   PainSc: [place]

## 2017-11-26 ENCOUNTER — Encounter (HOSPITAL_BASED_OUTPATIENT_CLINIC_OR_DEPARTMENT_OTHER): Payer: Self-pay | Admitting: Urology

## 2017-11-26 NOTE — Op Note (Signed)
NAME:  AERIK, POLAN NO.:  MEDICAL RECORD NO.:  [id number]  LOCATION:                                 FACILITY:  PHYSICIAN:  Alexis Frock, MD     DATE OF BIRTH:  1966-08-16  DATE OF PROCEDURE: 11/25/2017                               OPERATIVE REPORT   PREOPERATIVE DIAGNOSIS:  Moderate-risk prostate cancer.  PROCEDURES: 1. Cystoscopy. 2. Brachytherapy seed implantation with ultrasound. 3. SpaceOAR implantation.  ESTIMATED BLOOD LOSS:  Nil.  COMPLICATION:  None.  SPECIMEN:  None.  FINDINGS: 1. Implantation of 21 catheters containing 77 seeds for prescribed     dose of 145 Gy as per plan. 2. No evidence of intraluminal brachytherapy seeds with cystoscopy     following implantation. 3. Excellent placement of SpaceOAR prerectal gel matrix with     separation of the anterior rectal wall away from the base of the     prostate by followup ultrasound.  INDICATIONS:  Jordan [name] is a 51 year old man with recent history of moderate-risk adenocarcinoma of the prostate.  His prostate volume was relatively small at less than 40 g.  His priorities for management remained cancer controlled, but also minimization of downtime or likely sexual implications.  As such, he was elected to undergo primary brachytherapy for management of this cancer in combined approach with Radiation Oncology, Dr. Tammi Klippel and myself.  He was felt to be a suitable candidate.  Informed consent was obtained and placed in the medical record.  PROCEDURE IN DETAIL:  The patient being Jordan [name], was verified. Procedure being prostate brachytherapy implantation, cystoscopy and SpaceOAR was confirmed.  Procedure was carried out.  Time-out was performed.  Intravenous antibiotics were administered.  General anesthesia was introduced.  The patient was placed into a medium lithotomy position.  Foley catheter was placed per urethra to straight drain.  Perineum was prepped.  The ultrasound  unit and stepper were placed per rectum and planning performed as per Radiation Oncology note. Attention was directed at seed implantation was performed as per plan using 21 catheters to deploy 77 needles for a prescribed dose of 145 Gy. Following seed implantation, the grid apparatus was removed, the ultrasound was taken off pressure such that it remained with good mobilization of the rectal wall, but not compressing it, and cystoscopy was performed after prepping the penis with iodine.  Cystourethroscopy revealed normal anterior and posterior urethra, no kissing prostate lobes, no evidence of intraluminal brachytherapy seeds within the prostatic urethra or bladder.  Retroflexion of the bladder revealed no additional findings and the cystoscope was removed. Attention was directed at [facility] implantation.  Using ultrasound guidance in the combination of axial and sagittal planes, the SpaceOAR finder needle was navigated into mid prostate, mid glans in a plane anterior to the rectal wall of posterior to the posterior prostate.  A test of 2 mL of saline was injected and this was in a confirmed suitable plane.  Next, 10 mL of SpaceOAR gel matrix was instilled in the exact same location, which resulted in excellent separation of the posterior prostate from the anterior rectal wall as per plan.  All needles were removed.  Hemostasis appeared excellent. Sponge and needle counts were correct, and procedure was terminated. The patient tolerated the procedure well.  There were no immediate periprocedural complications.  The patient was taken to the postanesthesia care unit in stable condition.          ______________________________ Alexis Frock, MD     TM/MEDQ  D:  11/25/2017  T:  11/26/2017  Job:  [id number]

## 2017-11-29 NOTE — Progress Notes (Signed)
  Radiation Oncology         [phone] ________________________________  Name: Jordan [name] MRN: [id number]  Date: 11/29/2017  DOB: Apr 26, 1966       Prostate Seed Implant  OB:SJGGEZ, [name], MD  No ref. provider found  DIAGNOSIS: 51 y.o. gentleman with Stage T1c adenocarcinoma of the prostate with Gleason Score of 3+4, and PSA of 6.03.    ICD-10-CM   1. Preop examination Z01.818 DG Chest 2 View    DG Chest 2 View  2. Prostate cancer ([place]) C61 CANCELED: DG Chest 2 View    CANCELED: DG Chest 2 View    PROCEDURE: Insertion of radioactive I-125 seeds into the prostate gland.  RADIATION DOSE: 145 Gy, definitive therapy.  TECHNIQUE: TYLAR AMBORN was brought to the operating room with the urologist. He was placed in the dorsolithotomy position. He was catheterized and a rectal tube was inserted. The perineum was shaved, prepped and draped. The ultrasound probe was then introduced into the rectum to see the prostate gland.  TREATMENT DEVICE: A needle grid was attached to the ultrasound probe stand and anchor needles were placed.  3D PLANNING: The prostate was imaged in 3D using a sagittal sweep of the prostate probe. These images were transferred to the planning computer. There, the prostate, urethra and rectum were defined on each axial reconstructed image. Then, the software created an optimized 3D plan and a few seed positions were adjusted. The quality of the plan was reviewed using [facility] information for the target and the following two organs at risk:  Urethra and Rectum.  Then the accepted plan was uploaded to the seed Selectron afterloading unit.  PROSTATE VOLUME STUDY:  Using transrectal ultrasound the volume of the prostate was verified to be 49 cc.  SPECIAL TREATMENT PROCEDURE/SUPERVISION AND HANDLING: The Nucletron FIRST system was used to place the needles under sagittal guidance. A total of 21 needles were used to deposit 77 seeds in the prostate gland. The individual seed  activity was 0.464 mCi.  SpaceOAR:  Yes  COMPLEX SIMULATION: At the end of the procedure, an anterior radiograph of the pelvis was obtained to document seed positioning and count. Cystoscopy was performed to check the urethra and bladder.  MICRODOSIMETRY: At the end of the procedure, the patient was emitting 0.198 mR/hr at 1 meter. Accordingly, he was considered safe for hospital discharge.  PLAN: The patient will return to the radiation oncology clinic for post implant CT dosimetry in three weeks.   ________________________________  Sheral Apley Tammi Klippel, M.D.

## 2017-12-15 ENCOUNTER — Telehealth: Payer: Self-pay | Admitting: *Deleted

## 2017-12-15 NOTE — Telephone Encounter (Signed)
CALLED PATIENT TO REMIND OF APPTS. FOR 12-16-17, SPOKE WITH PATIENT AND HE IS AWARE OF THESE APPTS.

## 2017-12-16 ENCOUNTER — Ambulatory Visit (HOSPITAL_COMMUNITY)
Admission: RE | Admit: 2017-12-16 | Discharge: 2017-12-16 | Disposition: A | Discharge status: Home / Self Care | Payer: BLUE CROSS/BLUE SHIELD | Source: Ambulatory Visit | Attending: Urology | Admitting: Urology

## 2017-12-16 ENCOUNTER — Ambulatory Visit
Admission: RE | Admit: 2017-12-16 | Discharge: 2017-12-16 | Disposition: A | Discharge status: Home / Self Care | Payer: BLUE CROSS/BLUE SHIELD | Source: Ambulatory Visit | Attending: Radiation Oncology | Admitting: Radiation Oncology

## 2017-12-16 ENCOUNTER — Encounter: Payer: Self-pay | Admitting: Radiation Oncology

## 2017-12-16 ENCOUNTER — Other Ambulatory Visit: Payer: Self-pay

## 2017-12-16 VITALS — BP 139/70 | HR 79 | Resp 16 | Wt 171.8 lb

## 2017-12-16 DIAGNOSIS — C61 Malignant neoplasm of prostate: Secondary | ICD-10-CM | POA: Insufficient documentation

## 2017-12-16 NOTE — Progress Notes (Signed)
Radiation Oncology         [phone]) [phone] ________________________________  Name: Jordan [name] MRN: [id number]  Date: 12/16/2017  DOB: 10/14/1966  Follow-Up Visit Note  CC: Seward Carol, MD  Alexis Frock, MD  Diagnosis:   51 y.o. gentleman with Stage T1c adenocarcinoma of the prostate with Gleason Score of 3+4, and PSA of 6.03.    ICD-10-CM   1. Malignant neoplasm of prostate (HCC) C61     Interval Since Last Radiation:  3 weeks  Narrative:  The patient returns today for routine follow-up.  He is complaining of increased urinary frequency and urinary hesitation symptoms. He filled out a questionnaire regarding urinary function today providing and overall IPSS score of 5 characterizing his symptoms as mild urinary symptoms.  His pre-implant score was 0. He denies any bowel symptoms. Pt is doing well overall.   ALLERGIES:  has No Known Allergies.  Meds: Current Outpatient Medications  Medication Sig Dispense Refill  . albuterol (PROAIR HFA) 108 (90 BASE) MCG/ACT inhaler Inhale 2 puffs into the lungs every 6 (six) hours as needed.     . fluticasone-salmeterol (ADVAIR HFA) 230-21 MCG/ACT inhaler Inhale 2 puffs into the lungs 2 (two) times daily.    Marland Kitchen neomycin-polymyxin b-dexamethasone (MAXITROL) 3.5-10000-0.1 SUSP   0  . tamsulosin (FLOMAX) 0.4 MG CAPS capsule Take 1 capsule (0.4 mg total) by mouth daily as needed. For urinary urgency after prostate radiation 30 capsule 11  . traMADol (ULTRAM) 50 MG tablet Take 1-2 tablets (50-100 mg total) by mouth every 6 (six) hours as needed for moderate pain. Post-operatively 20 tablet 0   No current facility-administered medications for this encounter.     Physical Findings: The patient is in no acute distress. Patient is alert and oriented.  weight is 171 lb 12.8 oz (77.9 kg). His blood pressure is 139/70 and his pulse is 79. His respiration is 16 and oxygen saturation is 100%. .  No significant changes.  Lab Findings: Lab Results    Component Value Date   WBC 9.2 11/22/2017   HGB 15.2 11/22/2017   HCT 45.6 11/22/2017   MCV 83.5 11/22/2017   PLT 338 11/22/2017    Radiographic Findings:  Patient underwent CT imaging in our clinic for post implant dosimetry. The CT appears to demonstrate an adequate distribution of radioactive seeds throughout the prostate gland. There no seeds in her near the rectum. I suspect the final radiation plan and dosimetry will show appropriate coverage of the prostate gland.   Impression: The patient is recovering from the effects of radiation. His urinary symptoms should gradually improve over the next 4-6 months. We talked about this today. He is encouraged by his improvement already and is otherwise please with his outcome.   Plan: Today, I spent time talking to the patient about his prostate seed implant and resolving urinary symptoms. We also talked about long-term follow-up for prostate cancer following seed implant. He understands that ongoing PSA determinations and digital rectal exams will help perform surveillance to rule out disease recurrence. He understands what to expect with his PSA measures. Patient was also educated today about some of the long-term effects from radiation including a small risk for rectal bleeding and possibly erectile dysfunction. We talked about some of the general management approaches to these potential complications. However, I did encourage the patient to contact our office or return at any point if he has questions or concerns related to his previous radiation and prostate cancer.  _____________________________________  Rodman Key  Frazier Richards, M.D.  This document serves as a record of services personally performed by Tyler Pita, MD. It was created on his behalf by Valeta Harms, a trained medical scribe. The creation of this record is based on the scribe's personal observations and the provider's statements to them. This document has been checked and  approved by the attending provider.

## 2017-12-16 NOTE — Progress Notes (Signed)
  Radiation Oncology         [phone] ________________________________  Name: Jordan [name] MRN: [id number]  Date: 12/16/2017  DOB: [birth year]  COMPLEX SIMULATION NOTE  NARRATIVE:  The patient was brought to the [facility] suite today following prostate seed implantation approximately one month ago.  Identity was confirmed.  All relevant records and images related to the planned course of therapy were reviewed.  Then, the patient was set-up supine.  CT images were obtained.  The CT images were loaded into the planning software.  Then the prostate and rectum were contoured.  Treatment planning then occurred.  The implanted iodine 125 seeds were identified by the physics staff for projection of radiation distribution  I have requested : 3D Simulation  I have requested a DVH of the following structures: Prostate and rectum.    ________________________________  Sheral Apley Tammi Klippel, M.D.  This document serves as a record of services personally performed by Jordan Pita, MD. It was created on his behalf by Valeta Harms, a trained medical scribe. The creation of this record is based on the scribe's personal observations and the provider's statements to them. This document has been checked and approved by the attending provider.

## 2017-12-16 NOTE — Progress Notes (Signed)
Weight and vitals stable. Denies pain. Pre seed IPSS 0. Post seed IPSS 5 with a slightly weaker stream. Reports dysuria resolved once he began taking Flomax. Denies hematuria, leakage or incontinence. Denies diarrhea. Scheduled for MRI to confirm Space[employer] placement at 1000 today. Patient scheduled to follow up with Dr. Tresa Moore on 02/07/2018 at 1000.   BP 139/70 (BP Location: Right Arm, Patient Position: Sitting, Cuff Size: Normal)   Pulse 79   Resp 16   Wt 171 lb 12.8 oz (77.9 kg)   SpO2 100%   BMI 25.37 kg/m  Wt Readings from Last 3 Encounters:  12/16/17 171 lb 12.8 oz (77.9 kg)  11/25/17 175 lb 8 oz (79.6 kg)  09/09/17 174 lb 12.8 oz (79.3 kg)

## 2017-12-27 ENCOUNTER — Encounter: Payer: Self-pay | Admitting: Radiation Oncology

## 2017-12-27 DIAGNOSIS — C61 Malignant neoplasm of prostate: Secondary | ICD-10-CM | POA: Diagnosis not present

## 2018-01-09 NOTE — Progress Notes (Signed)
  Radiation Oncology         [phone] ________________________________  Name: Jordan [name] MRN: [id number]  Date: 12/27/2017  DOB: 05-Nov-1966  3D Planning Note   Prostate Brachytherapy Post-Implant Dosimetry  Diagnosis: 52 y.o. gentleman with Stage T1c adenocarcinoma of the prostate with Gleason Score of 3+4, and PSA of 6.03.  Narrative: On a previous date, Jordan [name] returned following prostate seed implantation for post implant planning. He underwent CT scan complex simulation to delineate the three-dimensional structures of the pelvis and demonstrate the radiation distribution.  Since that time, the seed localization, and complex isodose planning with dose volume histograms have now been completed.  Results:   Prostate Coverage - The dose of radiation delivered to the 90% or more of the prostate gland (D90) was 108.76% of the prescription dose. This exceeds our goal of greater than 90%. Rectal Sparing - The volume of rectal tissue receiving the prescription dose or higher was 0.0 cc. This falls under our thresholds tolerance of 1.0 cc.  Impression: The prostate seed implant appears to show adequate target coverage and appropriate rectal sparing.  Plan:  The patient will continue to follow with urology for ongoing PSA determinations. I would anticipate a high likelihood for local tumor control with minimal risk for rectal morbidity.  ________________________________  Sheral Apley Tammi Klippel, M.D.

## 2018-01-24 DIAGNOSIS — C61 Malignant neoplasm of prostate: Secondary | ICD-10-CM | POA: Diagnosis not present

## 2018-02-07 DIAGNOSIS — C61 Malignant neoplasm of prostate: Secondary | ICD-10-CM | POA: Diagnosis not present

## 2018-05-05 DIAGNOSIS — Z0000 Encounter for general adult medical examination without abnormal findings: Secondary | ICD-10-CM | POA: Diagnosis not present

## 2018-06-08 DIAGNOSIS — Z01818 Encounter for other preprocedural examination: Secondary | ICD-10-CM | POA: Diagnosis not present

## 2018-06-08 DIAGNOSIS — J45909 Unspecified asthma, uncomplicated: Secondary | ICD-10-CM | POA: Diagnosis not present

## 2018-06-08 DIAGNOSIS — Z1211 Encounter for screening for malignant neoplasm of colon: Secondary | ICD-10-CM | POA: Diagnosis not present

## 2018-07-13 DIAGNOSIS — K5730 Diverticulosis of large intestine without perforation or abscess without bleeding: Secondary | ICD-10-CM | POA: Diagnosis not present

## 2018-07-13 DIAGNOSIS — K635 Polyp of colon: Secondary | ICD-10-CM | POA: Diagnosis not present

## 2018-07-13 DIAGNOSIS — K6389 Other specified diseases of intestine: Secondary | ICD-10-CM | POA: Diagnosis not present

## 2018-07-13 DIAGNOSIS — K529 Noninfective gastroenteritis and colitis, unspecified: Secondary | ICD-10-CM | POA: Diagnosis not present

## 2018-07-13 DIAGNOSIS — Z1211 Encounter for screening for malignant neoplasm of colon: Secondary | ICD-10-CM | POA: Diagnosis not present

## 2018-07-19 DIAGNOSIS — Z1211 Encounter for screening for malignant neoplasm of colon: Secondary | ICD-10-CM | POA: Diagnosis not present

## 2018-07-19 DIAGNOSIS — K529 Noninfective gastroenteritis and colitis, unspecified: Secondary | ICD-10-CM | POA: Diagnosis not present

## 2018-07-19 DIAGNOSIS — K635 Polyp of colon: Secondary | ICD-10-CM | POA: Diagnosis not present

## 2018-12-20 DIAGNOSIS — C61 Malignant neoplasm of prostate: Secondary | ICD-10-CM | POA: Diagnosis not present

## 2020-01-02 ENCOUNTER — Other Ambulatory Visit: Payer: Self-pay | Admitting: Urology

## 2020-01-02 DIAGNOSIS — C61 Malignant neoplasm of prostate: Secondary | ICD-10-CM

## 2020-01-16 ENCOUNTER — Encounter (HOSPITAL_COMMUNITY)
Admission: RE | Admit: 2020-01-16 | Discharge: 2020-01-16 | Disposition: A | Discharge status: Home / Self Care | Payer: No Typology Code available for payment source | Source: Ambulatory Visit | Attending: Urology | Admitting: Urology

## 2020-01-16 ENCOUNTER — Other Ambulatory Visit: Payer: Self-pay

## 2020-01-16 DIAGNOSIS — C61 Malignant neoplasm of prostate: Secondary | ICD-10-CM | POA: Diagnosis not present

## 2020-01-16 MED ORDER — TECHNETIUM TC 99M MEDRONATE IV KIT
20.0000 | PACK | Freq: Once | INTRAVENOUS | Status: AC | PRN
  Administered 2020-01-16: 10:00:00 21 via INTRAVENOUS

## 2020-04-04 ENCOUNTER — Ambulatory Visit: Payer: No Typology Code available for payment source | Attending: Internal Medicine

## 2020-04-04 DIAGNOSIS — Z23 Encounter for immunization: Secondary | ICD-10-CM

## 2020-04-04 NOTE — Progress Notes (Signed)
   Covid-19 Vaccination Clinic  Name:  Jordan [name]    MRN: [id number] DOB: Jun 13, 1966  04/04/2020  Mr. Bebeau was observed post Covid-19 immunization for 15 minutes without incident. He was provided with Vaccine Information Sheet and instruction to access the V-Safe system.   Mr. Trammel was instructed to call 911 with any severe reactions post vaccine: Marland Kitchen Difficulty breathing  . Swelling of face and throat  . A fast heartbeat  . A bad rash all over body  . Dizziness and weakness   Immunizations Administered    Name Date Dose VIS Date Route   Pfizer COVID-19 Vaccine 04/04/2020  8:26 AM 0.3 mL 12/08/2019 Intramuscular   Manufacturer: [place]   Lot: [id number]   [place]: [id number]

## 2020-04-29 ENCOUNTER — Ambulatory Visit: Payer: No Typology Code available for payment source | Attending: Internal Medicine

## 2020-04-29 DIAGNOSIS — Z23 Encounter for immunization: Secondary | ICD-10-CM

## 2020-04-29 NOTE — Progress Notes (Signed)
   Covid-19 Vaccination Clinic  Name:  Jordan [name]    MRN: [id number] DOB: 10/18/1966  04/29/2020  [name] was observed post Covid-19 immunization for 15 minutes without incident. He was provided with Vaccine Information Sheet and instruction to access the V-Safe system.   [name] was instructed to call 911 with any severe reactions post vaccine: Marland Kitchen Difficulty breathing  . Swelling of face and throat  . A fast heartbeat  . A bad rash all over body  . Dizziness and weakness   Immunizations Administered    Name Date Dose VIS Date Route   Pfizer COVID-19 Vaccine 04/29/2020 10:56 AM 0.3 mL 02/21/2019 Intramuscular   Manufacturer: [place]   Lot: [id number]   [place]: [id number]

## 2020-09-27 ENCOUNTER — Ambulatory Visit: Payer: Self-pay | Admitting: General Surgery

## 2020-09-27 NOTE — H&P (Signed)
History of Present Illness Jordan [name]; 09/27/2020 11:Jordan [name]) The patient is a 54 year old [name] who presents with an inguinal hernia. Referred by: Dr. polite Chief Complaint: Bilateral hernias  Patient is a 54 year old [name] with bilateral inguinal hernias, left greater than right. Patient states is been there for several years. He states he does have some pain and some discomfort. He states that he has some more discomfort when he is lifting something heavy. 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  He's had no previous abdominal surgery. Patient had previous radiation seeds for prostate cancer.     Past Surgical History Emeline Gins, [place]; 09/27/2020 11:36 [name]) No pertinent past surgical history   Allergies Emeline Gins, CMA; 09/27/2020 11:37 [name]) No Known Drug Allergies  [09/27/2020]: Allergies Reconciled   Medication History Emeline Gins, CMA; 09/27/2020 11:37 [name]) Albuterol Sulfate HFA (108 (90 Base)MCG/ACT Aerosol Soln, Inhalation) Active. Medications Reconciled  Social History Emeline Gins, [place]; 09/27/2020 11:36 [name]) No alcohol use  No caffeine use  No drug use  Tobacco use  Never smoker.  Family History Emeline Gins, [place]; 09/27/2020 11:36 [name]) Cancer  Brother. Prostate Cancer  Father.  Other Problems Emeline Gins, [place]; 09/27/2020 11:36 [name]) No pertinent past medical history     Review of Systems Jordan [name]; 09/27/2020 11:Jordan [name]) General Not Present- Appetite Loss, Chills, Fatigue, Fever, Night Sweats, Weight Gain and Weight Loss. Skin Not Present- Change in Wart/Mole, Dryness, Hives, Jaundice, New Lesions, Non-Healing Wounds, Rash and Ulcer. HEENT Not Present- Earache, Hearing Loss, Hoarseness, Nose Bleed, Oral Ulcers, Ringing in the Ears, Seasonal Allergies, Sinus Pain, Sore Throat, Visual Disturbances, Wears glasses/contact lenses and Yellow Eyes. Respiratory Not Present- Bloody sputum, Chronic  Cough, Difficulty Breathing, Snoring and Wheezing. Breast Not Present- Breast Mass, Breast Pain, Nipple Discharge and Skin Changes. Cardiovascular Not Present- Chest Pain, Difficulty Breathing Lying Down, Leg Cramps, Palpitations, Rapid Heart Rate, Shortness of Breath and Swelling of Extremities. Gastrointestinal Not Present- Abdominal Pain, Bloating, Bloody Stool, Change in Bowel Habits, Chronic diarrhea, Constipation, Difficulty Swallowing, Excessive gas, Gets full quickly at meals, Hemorrhoids, Indigestion, Nausea, Rectal Pain and Vomiting. [name] Genitourinary Not Present- Blood in Urine, Change in Urinary Stream, Frequency, Impotence, Nocturia, Painful Urination, Urgency and Urine Leakage. Musculoskeletal Not Present- Back Pain, Joint Pain, Joint Stiffness, Muscle Pain, Muscle Weakness and Swelling of Extremities. Neurological Not Present- Decreased Memory, Fainting, Headaches, Numbness, Seizures, Tingling, Tremor, Trouble walking and Weakness. Psychiatric Not Present- Anxiety, Bipolar, Change in Sleep Pattern, Depression, Fearful and Frequent crying. Endocrine Not Present- Cold Intolerance, Excessive Hunger, Hair Changes, Heat Intolerance, Hot flashes and New Diabetes. Hematology Not Present- Blood Thinners, Easy Bruising, Excessive bleeding, Gland problems, HIV and Persistent Infections. All other systems negative  Vitals Emeline Gins CMA; 09/27/2020 11:37 [name]) 09/27/2020 11:37 [name] Weight: 155.2 lb Height: 68in Body Surface Area: 1.84 m Body Mass Index: 23.6 kg/m  Temp.: 98.70F  Pulse: 88 (Regular)  BP: 114/80(Sitting, Left Arm, Standard)       Physical Exam Jordan [name]; 09/27/2020 11:45 [name]) The physical exam findings are as follows: Note: Constitutional: No acute distress, conversant, appears stated age  Eyes: Anicteric sclerae, moist conjunctiva, no lid lag  Neck: No thyromegaly, trachea midline, no cervical lymphadenopathy  Lungs: Clear to auscultation  biilaterally, normal respiratory effot  Cardiovascular: regular rate & rhythm, no murmurs, no peripheal edema, pedal pulses 2+  GI: Soft, no masses or hepatosplenomegaly, non-tender to palpation  MSK: Normal gait, no clubbing cyanosis, edema  Skin:  No rashes, palpation reveals normal skin turgor  Psychiatric: Appropriate judgment and insight, oriented to person, place, and time  Abdomen Inspection Hernias - Bilateral - Inguinal hernia - Reducible - Bilateral.    Assessment & Plan Jordan [name]; 09/27/2020 11:45 [name]) BILATERAL INGUINAL HERNIA WITHOUT OBSTRUCTION OR GANGRENE, RECURRENCE NOT SPECIFIED (K40.20) Impression: 54 year old [name] with bilateral inguinal hernias, history of prostate cancer  1. The patient will like to proceed to the operating room for laparoscopic bilateral inguinal hernia repair with mesh.  2. I discussed with the patient the signs and symptoms of incarceration and strangulation and the need to proceed to the ER should they occur.  3. I discussed with the patient the risks and benefits of the procedure to include but not limited to: Infection, bleeding, damage to surrounding structures, possible need for further surgery, possible nerve pain, and possible recurrence. The patient was understanding and wishes to proceed.

## 2020-11-08 NOTE — Progress Notes (Signed)
Your procedure is scheduled on Tuesday, November 23rd.  Report to [facility] Main Entrance "A" at 11:15 A.M., and check in at the Admitting office.  Call this number if you have problems the morning of surgery:  [phone]  Call [phone] if you have any questions prior to your surgery date Monday-Friday 8am-4pm   Remember:  Do not eat after midnight the night before your surgery  You may drink clear liquids until 10:15 A.M. the morning of your surgery.   Clear liquids allowed are: Water, Non-Citrus Juices (without pulp), Carbonated Beverages, Clear Tea, Black Coffee Only, and Gatorade   Please complete your PRE-SURGERY ENSURE that was provided to you by 10:15 A.M. the morning of surgery.  Please, if able, drink it in one setting. DO NOT SIP.   Take these medicines the morning of surgery with A SIP OF WATER  fluticasone-salmeterol (ADVAIR HFA)/inhaler  If needed: albuterol (PROAIR HFA) 108 (90 BASE)/inhaler - bring with you the day of surgery  As of today, STOP taking any Aspirin (unless otherwise instructed by your surgeon) Aleve, Naproxen, Ibuprofen, Motrin, Advil, Goody's, BC's, all herbal medications, fish oil, and all vitamins.                    Do not wear jewelry            Do not wear lotions, powders, colognes, or deodorant.            Men may shave face and neck.            Do not bring valuables to the hospital.            [facility] is not responsible for any belongings or valuables.  Do NOT Smoke (Tobacco/Vaping) or drink Alcohol 24 hours prior to your procedure If you use a CPAP at night, you may bring all equipment for your overnight stay.   Contacts, glasses, dentures or bridgework may not be worn into surgery.      For patients admitted to the hospital, discharge time will be determined by your treatment team.   Patients discharged the day of surgery will not be allowed to drive home, and someone needs to stay with them for 24 hours.  Special  instructions:   [place]- Preparing For Surgery  Before surgery, you can play an important role. Because skin is not sterile, your skin needs to be as free of germs as possible. You can reduce the number of germs on your skin by washing with CHG (chlorahexidine gluconate) Soap before surgery.  CHG is an antiseptic cleaner which kills germs and bonds with the skin to continue killing germs even after washing.    Oral Hygiene is also important to reduce your risk of infection.  Remember - BRUSH YOUR TEETH THE MORNING OF SURGERY WITH YOUR REGULAR TOOTHPASTE  Please do not use if you have an allergy to CHG or antibacterial soaps. If your skin becomes reddened/irritated stop using the CHG.  Do not shave (including legs and underarms) for at least 48 hours prior to first CHG shower. It is OK to shave your face.  Please follow these instructions carefully.   1. Shower the NIGHT BEFORE SURGERY and the MORNING OF SURGERY with CHG Soap.   2. If you chose to wash your hair, wash your hair first as usual with your normal shampoo.  3. After you shampoo, rinse your hair and body thoroughly to remove the shampoo.  4. Use CHG as you  would any other liquid soap. You can apply CHG directly to the skin and wash gently with a scrungie or a clean washcloth.   5. Apply the CHG Soap to your body ONLY FROM THE NECK DOWN.  Do not use on open wounds or open sores. Avoid contact with your eyes, ears, mouth and genitals (private parts). Wash Face and genitals (private parts)  with your normal soap.   6. Wash thoroughly, paying special attention to the area where your surgery will be performed.  7. Thoroughly rinse your body with warm water from the neck down.  8. DO NOT shower/wash with your normal soap after using and rinsing off the CHG Soap.  9. Pat yourself dry with a CLEAN TOWEL.  10. Wear CLEAN PAJAMAS to bed the night before surgery  11. Place CLEAN SHEETS on your bed the night of your first shower and  DO NOT SLEEP WITH PETS.  Day of Surgery: Wear Clean/Comfortable clothing the morning of surgery Do not apply any deodorants/lotions.   Remember to brush your teeth WITH YOUR REGULAR TOOTHPASTE.   Please read over the following fact sheets that you were given.

## 2020-11-11 ENCOUNTER — Encounter (HOSPITAL_COMMUNITY): Payer: Self-pay

## 2020-11-11 ENCOUNTER — Encounter (HOSPITAL_COMMUNITY)
Admission: RE | Admit: 2020-11-11 | Discharge: 2020-11-11 | Disposition: A | Discharge status: Home / Self Care | Payer: Commercial Managed Care - PPO | Source: Ambulatory Visit | Attending: General Surgery | Admitting: General Surgery

## 2020-11-11 ENCOUNTER — Other Ambulatory Visit: Payer: Self-pay

## 2020-11-11 DIAGNOSIS — Z01812 Encounter for preprocedural laboratory examination: Secondary | ICD-10-CM | POA: Insufficient documentation

## 2020-11-11 LAB — CBC
HCT: 44.3 % (ref 39.0–52.0)
Hemoglobin: 14.2 g/dL (ref 13.0–17.0)
MCH: 27.4 pg (ref 26.0–34.0)
MCHC: 32.1 g/dL (ref 30.0–36.0)
MCV: 85.5 fL (ref 80.0–100.0)
Platelets: 363 10*3/uL (ref 150–400)
RBC: 5.18 MIL/uL (ref 4.22–5.81)
RDW: 14.3 % (ref 11.5–15.5)
WBC: 8.7 10*3/uL (ref 4.0–10.5)
nRBC: 0 % (ref 0.0–0.2)

## 2020-11-11 NOTE — Progress Notes (Signed)
PCP - Dr. Seward Carol Cardiologist - denies  Chest x-ray - n/a EKG - n/a Stress Test - n/a ECHO - n/a Cardiac Cath - n/a  Sleep Study - denies CPAP - denies  Blood Thinner Instructions:n/a Aspirin Instructions:n/a  ERAS Protcol -Yes, pt is to stop clears by 1015 DOS. PRE-SURGERY Ensure or G2- Pt provided with (1) Pre-surgical ensure.   COVID TEST- Scheduled for 11/15/20 @ 1000. Pt aware that he must quarantine.    Anesthesia review: No  Patient denies shortness of breath, fever, cough and chest pain at PAT appointment   All instructions explained to the patient, with a verbal understanding of the material. Patient agrees to go over the instructions while at home for a better understanding. Patient also instructed to self quarantine after being tested for COVID-19. The opportunity to ask questions was provided.    Coronavirus Screening  Have you experienced the following symptoms:  Cough yes/no: No Fever (>100.36F)  yes/no: No Runny nose yes/no: No Sore throat yes/no: No Difficulty breathing/shortness of breath  yes/no: No  Have you or a family member traveled in the last 14 days and where? yes/no: No   If the patient indicates "YES" to the above questions, their PAT will be rescheduled to limit the exposure to others and, the surgeon will be notified. THE PATIENT WILL NEED TO BE ASYMPTOMATIC FOR 14 DAYS.   If the patient is not experiencing any of these symptoms, the PAT nurse will instruct them to NOT bring anyone with them to their appointment since they may have these symptoms or traveled as well.   Please remind your patients and families that hospital visitation restrictions are in effect and the importance of the restrictions.

## 2020-11-15 ENCOUNTER — Other Ambulatory Visit (HOSPITAL_COMMUNITY)
Admission: RE | Admit: 2020-11-15 | Discharge: 2020-11-15 | Disposition: A | Discharge status: Home / Self Care | Payer: Commercial Managed Care - PPO | Source: Ambulatory Visit | Attending: General Surgery | Admitting: General Surgery

## 2020-11-15 DIAGNOSIS — Z20822 Contact with and (suspected) exposure to covid-19: Secondary | ICD-10-CM | POA: Diagnosis not present

## 2020-11-15 DIAGNOSIS — Z01812 Encounter for preprocedural laboratory examination: Secondary | ICD-10-CM | POA: Diagnosis not present

## 2020-11-15 LAB — SARS CORONAVIRUS 2 (TAT 6-24 HRS): SARS Coronavirus 2: NEGATIVE

## 2020-11-18 NOTE — H&P (Signed)
History of Present Illness  The patient is a 54 year old male who presents with an inguinal hernia. Referred by: Dr. polite Chief Complaint: Bilateral hernias  Patient is a 54 year old male with bilateral inguinal hernias, left greater than right. Patient states is been there for several years. He states he does have some pain and some discomfort. He states that he has some more discomfort when he is lifting something heavy. 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  He's had no previous abdominal surgery. Patient had previous radiation seeds for prostate cancer.     Past Surgical History No pertinent past surgical history   Allergies  No Known Drug Allergies  [09/27/2020]: Allergies Reconciled   Medication History  Albuterol Sulfate HFA (108 (90 Base)MCG/ACT Aerosol Soln, Inhalation) Active. Medications Reconciled  Social History  No alcohol use  No caffeine use  No drug use  Tobacco use  Never smoker.  Family History  Cancer  Brother. Prostate Cancer  Father.  Other Problems  No pertinent past medical history     Review of Systems  General Not Present- Appetite Loss, Chills, Fatigue, Fever, Night Sweats, Weight Gain and Weight Loss. Skin Not Present- Change in Wart/Mole, Dryness, Hives, Jaundice, New Lesions, Non-Healing Wounds, Rash and Ulcer. HEENT Not Present- Earache, Hearing Loss, Hoarseness, Nose Bleed, Oral Ulcers, Ringing in the Ears, Seasonal Allergies, Sinus Pain, Sore Throat, Visual Disturbances, Wears glasses/contact lenses and Yellow Eyes. Respiratory Not Present- Bloody sputum, Chronic Cough, Difficulty Breathing, Snoring and Wheezing. Breast Not Present- Breast Mass, Breast Pain, Nipple Discharge and Skin Changes. Cardiovascular Not Present- Chest Pain, Difficulty Breathing Lying Down, Leg Cramps, Palpitations, Rapid Heart Rate, Shortness of Breath and Swelling of Extremities. Gastrointestinal Not  Present- Abdominal Pain, Bloating, Bloody Stool, Change in Bowel Habits, Chronic diarrhea, Constipation, Difficulty Swallowing, Excessive gas, Gets full quickly at meals, Hemorrhoids, Indigestion, Nausea, Rectal Pain and Vomiting. Male Genitourinary Not Present- Blood in Urine, Change in Urinary Stream, Frequency, Impotence, Nocturia, Painful Urination, Urgency and Urine Leakage. Musculoskeletal Not Present- Back Pain, Joint Pain, Joint Stiffness, Muscle Pain, Muscle Weakness and Swelling of Extremities. Neurological Not Present- Decreased Memory, Fainting, Headaches, Numbness, Seizures, Tingling, Tremor, Trouble walking and Weakness. Psychiatric Not Present- Anxiety, Bipolar, Change in Sleep Pattern, Depression, Fearful and Frequent crying. Endocrine Not Present- Cold Intolerance, Excessive Hunger, Hair Changes, Heat Intolerance, Hot flashes and New Diabetes. Hematology Not Present- Blood Thinners, Easy Bruising, Excessive bleeding, Gland problems, HIV and Persistent Infections. All other systems negative       Physical Exam  The physical exam findings are as follows: Note: Constitutional: No acute distress, conversant, appears stated age  Eyes: Anicteric sclerae, moist conjunctiva, no lid lag  Neck: No thyromegaly, trachea midline, no cervical lymphadenopathy  Lungs: Clear to auscultation biilaterally, normal respiratory effot  Cardiovascular: regular rate & rhythm, no murmurs, no peripheal edema, pedal pulses 2+  GI: Soft, no masses or hepatosplenomegaly, non-tender to palpation  MSK: Normal gait, no clubbing cyanosis, edema  Skin: No rashes, palpation reveals normal skin turgor  Psychiatric: Appropriate judgment and insight, oriented to person, place, and time  Abdomen Inspection Hernias - Bilateral - Inguinal hernia - Reducible - Bilateral.    Assessment & Plan  BILATERAL INGUINAL HERNIA WITHOUT OBSTRUCTION OR GANGRENE, RECURRENCE NOT SPECIFIED  (K40.20) Impression: 54 year old male with bilateral inguinal hernias, history of prostate cancer  1. The patient will like to proceed to the operating room for laparoscopic bilateral inguinal hernia repair with mesh.  2. I discussed  with the patient the signs and symptoms of incarceration and strangulation and the need to proceed to the ER should they occur.  3. I discussed with the patient the risks and benefits of the procedure to include but not limited to: Infection, bleeding, damage to surrounding structures, possible need for further surgery, possible nerve pain, and possible recurrence. The patient was understanding and wishes to proceed.

## 2020-11-19 ENCOUNTER — Ambulatory Visit (HOSPITAL_COMMUNITY)
Admission: RE | Admit: 2020-11-19 | Discharge: 2020-11-19 | Disposition: A | Discharge status: Home / Self Care | Payer: Commercial Managed Care - PPO | Attending: General Surgery | Admitting: General Surgery

## 2020-11-19 ENCOUNTER — Ambulatory Visit (HOSPITAL_COMMUNITY): Payer: Commercial Managed Care - PPO | Admitting: Anesthesiology

## 2020-11-19 ENCOUNTER — Encounter (HOSPITAL_COMMUNITY)
Admission: RE | Disposition: A | Discharge status: Home / Self Care | Payer: Self-pay | Source: Home / Self Care | Attending: General Surgery

## 2020-11-19 DIAGNOSIS — Z87891 Personal history of nicotine dependence: Secondary | ICD-10-CM | POA: Insufficient documentation

## 2020-11-19 DIAGNOSIS — Z8042 Family history of malignant neoplasm of prostate: Secondary | ICD-10-CM | POA: Insufficient documentation

## 2020-11-19 DIAGNOSIS — Z79899 Other long term (current) drug therapy: Secondary | ICD-10-CM | POA: Insufficient documentation

## 2020-11-19 DIAGNOSIS — J45909 Unspecified asthma, uncomplicated: Secondary | ICD-10-CM | POA: Diagnosis not present

## 2020-11-19 DIAGNOSIS — Z7951 Long term (current) use of inhaled steroids: Secondary | ICD-10-CM | POA: Insufficient documentation

## 2020-11-19 DIAGNOSIS — Z809 Family history of malignant neoplasm, unspecified: Secondary | ICD-10-CM | POA: Diagnosis not present

## 2020-11-19 DIAGNOSIS — Z923 Personal history of irradiation: Secondary | ICD-10-CM | POA: Insufficient documentation

## 2020-11-19 DIAGNOSIS — D176 Benign lipomatous neoplasm of spermatic cord: Secondary | ICD-10-CM | POA: Diagnosis not present

## 2020-11-19 DIAGNOSIS — K4020 Bilateral inguinal hernia, without obstruction or gangrene, not specified as recurrent: Secondary | ICD-10-CM | POA: Diagnosis not present

## 2020-11-19 DIAGNOSIS — Z8546 Personal history of malignant neoplasm of prostate: Secondary | ICD-10-CM | POA: Insufficient documentation

## 2020-11-19 HISTORY — PX: INSERTION OF MESH: SHX5868

## 2020-11-19 HISTORY — PX: XI ROBOTIC ASSISTED INGUINAL HERNIA REPAIR WITH MESH: SHX6706

## 2020-11-19 SURGERY — REPAIR, HERNIA, INGUINAL, ROBOT-ASSISTED, LAPAROSCOPIC, USING MESH
Anesthesia: General | Site: Inguinal | Laterality: Bilateral

## 2020-11-19 MED ORDER — SODIUM CHLORIDE 0.9 % IV SOLN
INTRAVENOUS | Status: DC | PRN
  Administered 2020-11-19: 40 mL

## 2020-11-19 MED ORDER — PHENYLEPHRINE 40 MCG/ML (10ML) SYRINGE FOR IV PUSH (FOR BLOOD PRESSURE SUPPORT)
PREFILLED_SYRINGE | INTRAVENOUS | Status: DC | PRN
  Administered 2020-11-19: 40 ug via INTRAVENOUS
  Administered 2020-11-19: 80 ug via INTRAVENOUS

## 2020-11-19 MED ORDER — LACTATED RINGERS IV SOLN: INTRAVENOUS | Status: DC

## 2020-11-19 MED ORDER — BUPIVACAINE HCL 0.25 % IJ SOLN
INTRAMUSCULAR | Status: DC | PRN
  Administered 2020-11-19: 8 mL

## 2020-11-19 MED ORDER — DEXAMETHASONE SODIUM PHOSPHATE 10 MG/ML IJ SOLN
INTRAMUSCULAR | Status: AC
  Filled 2020-11-19: qty 1

## 2020-11-19 MED ORDER — MIDAZOLAM HCL 2 MG/2ML IJ SOLN
INTRAMUSCULAR | Status: DC | PRN
  Administered 2020-11-19: 2 mg via INTRAVENOUS

## 2020-11-19 MED ORDER — ONDANSETRON HCL 4 MG/2ML IJ SOLN
INTRAMUSCULAR | Status: DC | PRN
  Administered 2020-11-19: 4 mg via INTRAVENOUS

## 2020-11-19 MED ORDER — FENTANYL CITRATE (PF) 250 MCG/5ML IJ SOLN
INTRAMUSCULAR | Status: DC | PRN
  Administered 2020-11-19: 100 ug via INTRAVENOUS

## 2020-11-19 MED ORDER — ACETAMINOPHEN 500 MG PO TABS
1000.0000 mg | ORAL_TABLET | ORAL | Status: AC
  Administered 2020-11-19: 1000 mg via ORAL
  Filled 2020-11-19: qty 2

## 2020-11-19 MED ORDER — PHENYLEPHRINE 40 MCG/ML (10ML) SYRINGE FOR IV PUSH (FOR BLOOD PRESSURE SUPPORT)
PREFILLED_SYRINGE | INTRAVENOUS | Status: AC
  Filled 2020-11-19: qty 10

## 2020-11-19 MED ORDER — ONDANSETRON HCL 4 MG/2ML IJ SOLN
INTRAMUSCULAR | Status: AC
  Filled 2020-11-19: qty 2

## 2020-11-19 MED ORDER — CHLORHEXIDINE GLUCONATE CLOTH 2 % EX PADS: 6.0000 | MEDICATED_PAD | Freq: Once | CUTANEOUS | Status: DC

## 2020-11-19 MED ORDER — ENSURE PRE-SURGERY PO LIQD: 296.0000 mL | Freq: Once | ORAL | Status: DC

## 2020-11-19 MED ORDER — PROMETHAZINE HCL 25 MG/ML IJ SOLN: 6.2500 mg | INTRAMUSCULAR | Status: DC | PRN

## 2020-11-19 MED ORDER — SUGAMMADEX SODIUM 200 MG/2ML IV SOLN
INTRAVENOUS | Status: DC | PRN
  Administered 2020-11-19 (×2): 100 mg via INTRAVENOUS

## 2020-11-19 MED ORDER — MIDAZOLAM HCL 2 MG/2ML IJ SOLN
INTRAMUSCULAR | Status: AC
  Filled 2020-11-19: qty 2

## 2020-11-19 MED ORDER — LIDOCAINE 2% (20 MG/ML) 5 ML SYRINGE
INTRAMUSCULAR | Status: DC | PRN
  Administered 2020-11-19: 100 mg via INTRAVENOUS

## 2020-11-19 MED ORDER — TRAMADOL HCL 50 MG PO TABS
50.0000 mg | ORAL_TABLET | Freq: Four times a day (QID) | ORAL | 0 refills | Status: AC | PRN
Start: 2020-11-19 — End: 2021-11-19

## 2020-11-19 MED ORDER — DEXAMETHASONE SODIUM PHOSPHATE 10 MG/ML IJ SOLN
INTRAMUSCULAR | Status: DC | PRN
  Administered 2020-11-19: 8 mg via INTRAVENOUS

## 2020-11-19 MED ORDER — 0.9 % SODIUM CHLORIDE (POUR BTL) OPTIME
TOPICAL | Status: DC | PRN
  Administered 2020-11-19: 1000 mL

## 2020-11-19 MED ORDER — LIDOCAINE HCL (PF) 2 % IJ SOLN
INTRAMUSCULAR | Status: AC
  Filled 2020-11-19: qty 5

## 2020-11-19 MED ORDER — PROPOFOL 10 MG/ML IV BOLUS
INTRAVENOUS | Status: DC | PRN
  Administered 2020-11-19: 150 mg via INTRAVENOUS

## 2020-11-19 MED ORDER — FENTANYL CITRATE (PF) 250 MCG/5ML IJ SOLN
INTRAMUSCULAR | Status: AC
  Filled 2020-11-19: qty 5

## 2020-11-19 MED ORDER — GABAPENTIN 300 MG PO CAPS
300.0000 mg | ORAL_CAPSULE | ORAL | Status: AC
  Administered 2020-11-19: 300 mg via ORAL
  Filled 2020-11-19: qty 1

## 2020-11-19 MED ORDER — ROCURONIUM BROMIDE 10 MG/ML (PF) SYRINGE
PREFILLED_SYRINGE | INTRAVENOUS | Status: DC | PRN
  Administered 2020-11-19: 100 mg via INTRAVENOUS

## 2020-11-19 MED ORDER — BUPIVACAINE LIPOSOME 1.3 % IJ SUSP
20.0000 mL | Freq: Once | INTRAMUSCULAR | Status: DC
  Filled 2020-11-19: qty 20

## 2020-11-19 MED ORDER — ROCURONIUM BROMIDE 10 MG/ML (PF) SYRINGE
PREFILLED_SYRINGE | INTRAVENOUS | Status: AC
  Filled 2020-11-19: qty 10

## 2020-11-19 MED ORDER — STERILE WATER FOR IRRIGATION IR SOLN
Status: DC | PRN
  Administered 2020-11-19: 100 mL via INTRAVESICAL

## 2020-11-19 MED ORDER — CEFAZOLIN SODIUM-DEXTROSE 2-4 GM/100ML-% IV SOLN
2.0000 g | INTRAVENOUS | Status: AC
  Administered 2020-11-19: 2 g via INTRAVENOUS
  Filled 2020-11-19: qty 100

## 2020-11-19 MED ORDER — CHLORHEXIDINE GLUCONATE 0.12 % MT SOLN: 15.0000 mL | Freq: Once | OROMUCOSAL | Status: AC

## 2020-11-19 MED ORDER — CHLORHEXIDINE GLUCONATE 0.12 % MT SOLN
OROMUCOSAL | Status: AC
  Administered 2020-11-19: 15 mL via OROMUCOSAL
  Filled 2020-11-19: qty 15

## 2020-11-19 MED ORDER — PROPOFOL 10 MG/ML IV BOLUS
INTRAVENOUS | Status: AC
  Filled 2020-11-19: qty 20

## 2020-11-19 MED ORDER — ORAL CARE MOUTH RINSE: 15.0000 mL | Freq: Once | OROMUCOSAL | Status: AC

## 2020-11-19 MED ORDER — FENTANYL CITRATE (PF) 100 MCG/2ML IJ SOLN
25.0000 ug | INTRAMUSCULAR | Status: DC | PRN
  Administered 2020-11-19: 25 ug via INTRAVENOUS

## 2020-11-19 MED ORDER — BUPIVACAINE HCL (PF) 0.25 % IJ SOLN
INTRAMUSCULAR | Status: AC
  Filled 2020-11-19: qty 30

## 2020-11-19 MED ORDER — FENTANYL CITRATE (PF) 100 MCG/2ML IJ SOLN
INTRAMUSCULAR | Status: AC
  Filled 2020-11-19: qty 2

## 2020-11-19 MED ORDER — ALBUMIN HUMAN 5 % IV SOLN: INTRAVENOUS | Status: DC | PRN

## 2020-11-19 SURGICAL SUPPLY — 63 items
ADH SKN CLS APL DERMABOND .7 (GAUZE/BANDAGES/DRESSINGS) ×1
APL PRP STRL LF DISP 70% ISPRP (MISCELLANEOUS) ×1
CHLORAPREP W/TINT 26 (MISCELLANEOUS) ×2 IMPLANT
COVER MAYO STAND STRL (DRAPES) ×2 IMPLANT
COVER SURGICAL LIGHT HANDLE (MISCELLANEOUS) ×2 IMPLANT
COVER TIP SHEARS 8 DVNC (MISCELLANEOUS) ×1 IMPLANT
COVER TIP SHEARS 8MM DA VINCI (MISCELLANEOUS) ×2
COVER WAND RF STERILE (DRAPES) IMPLANT
DECANTER SPIKE VIAL GLASS SM (MISCELLANEOUS) ×2 IMPLANT
DEFOGGER SCOPE WARMER CLEARIFY (MISCELLANEOUS) ×2 IMPLANT
DERMABOND ADVANCED (GAUZE/BANDAGES/DRESSINGS) ×1
DERMABOND ADVANCED .7 DNX12 (GAUZE/BANDAGES/DRESSINGS) ×1 IMPLANT
DEVICE TROCAR PUNCTURE CLOSURE (ENDOMECHANICALS) ×2 IMPLANT
DRAPE ARM DVNC X/XI (DISPOSABLE) ×4 IMPLANT
DRAPE COLUMN DVNC XI (DISPOSABLE) ×1 IMPLANT
DRAPE CV SPLIT W-CLR ANES SCRN (DRAPES) ×2 IMPLANT
DRAPE DA VINCI XI ARM (DISPOSABLE) ×8
DRAPE DA VINCI XI COLUMN (DISPOSABLE) ×2
DRAPE ORTHO SPLIT 77X108 STRL (DRAPES) ×2
DRAPE SURG ORHT 6 SPLT 77X108 (DRAPES) ×1 IMPLANT
ELECT REM PT RETURN 9FT ADLT (ELECTROSURGICAL) ×2
ELECTRODE REM PT RTRN 9FT ADLT (ELECTROSURGICAL) ×1 IMPLANT
GLOVE BIO SURGEON STRL SZ7.5 (GLOVE) ×4 IMPLANT
GOWN STRL REUS W/ TWL LRG LVL3 (GOWN DISPOSABLE) ×2 IMPLANT
GOWN STRL REUS W/ TWL XL LVL3 (GOWN DISPOSABLE) ×2 IMPLANT
GOWN STRL REUS W/TWL 2XL LVL3 (GOWN DISPOSABLE) ×2 IMPLANT
GOWN STRL REUS W/TWL LRG LVL3 (GOWN DISPOSABLE) ×4
GOWN STRL REUS W/TWL XL LVL3 (GOWN DISPOSABLE) ×4
KIT BASIN OR (CUSTOM PROCEDURE TRAY) ×2 IMPLANT
KIT TURNOVER KIT B (KITS) ×2 IMPLANT
MARKER SKIN DUAL TIP RULER LAB (MISCELLANEOUS) ×2 IMPLANT
MESH PROGRIP LAP SELF FIXATING (Mesh General) ×4 IMPLANT
MESH PROGRIP LAP SLF FIX 16X12 (Mesh General) ×1 IMPLANT
NDL INSUFFLATION 14GA 120MM (NEEDLE) ×1 IMPLANT
NEEDLE HYPO 22GX1.5 SAFETY (NEEDLE) ×2 IMPLANT
NEEDLE INSUFFLATION 14GA 120MM (NEEDLE) ×2 IMPLANT
OBTURATOR OPTICAL STANDARD 8MM (TROCAR)
OBTURATOR OPTICAL STND 8 DVNC (TROCAR)
OBTURATOR OPTICALSTD 8 DVNC (TROCAR) IMPLANT
PAD ARMBOARD 7.5X6 YLW CONV (MISCELLANEOUS) ×4 IMPLANT
PENCIL SMOKE EVACUATOR (MISCELLANEOUS) IMPLANT
SCISSORS LAP 5X35 DISP (ENDOMECHANICALS) IMPLANT
SEAL CANN UNIV 5-8 DVNC XI (MISCELLANEOUS) ×2 IMPLANT
SEAL XI 5MM-8MM UNIVERSAL (MISCELLANEOUS) ×4
SET IRRIG TUBING LAPAROSCOPIC (IRRIGATION / IRRIGATOR) IMPLANT
SET TUBE SMOKE EVAC HIGH FLOW (TUBING) ×2 IMPLANT
STAPLER CANNULA SEAL DVNC XI (STAPLE) IMPLANT
STAPLER CANNULA SEAL XI (STAPLE)
STOPCOCK 4 WAY LG BORE MALE ST (IV SETS) ×2 IMPLANT
SUT DVC VLOC 180 2-0 12IN GS21 (SUTURE)
SUT MNCRL AB 4-0 PS2 18 (SUTURE) ×2 IMPLANT
SUT VIC AB 2-0 SH 27 (SUTURE)
SUT VIC AB 2-0 SH 27X BRD (SUTURE) IMPLANT
SUT VLOC 180 2-0 6IN GS21 (SUTURE) ×3 IMPLANT
SUT VLOC 180 2-0 9IN GS21 (SUTURE) ×1 IMPLANT
SUTURE DVC VL 180 2-0 12INGS21 (SUTURE) ×1 IMPLANT
SYR 30ML SLIP (SYRINGE) ×2 IMPLANT
SYR TOOMEY 50ML (SYRINGE) ×2 IMPLANT
TOWEL GREEN STERILE FF (TOWEL DISPOSABLE) ×2 IMPLANT
TRAY FOLEY MTR SLVR 14FR STAT (SET/KITS/TRAYS/PACK) ×1 IMPLANT
TRAY FOLEY MTR SLVR 16FR STAT (SET/KITS/TRAYS/PACK) IMPLANT
TRAY LAPAROSCOPIC MC (CUSTOM PROCEDURE TRAY) ×2 IMPLANT
TROCAR XCEL NON-BLD 5MMX100MML (ENDOMECHANICALS) IMPLANT

## 2020-11-19 NOTE — Anesthesia Procedure Notes (Signed)
Procedure Name: Intubation Date/Time: 11/19/2020 12:51 PM Performed by: Kathryne Hitch, CRNA Pre-anesthesia Checklist: Patient identified, Emergency Drugs available, Suction available and Patient being monitored Patient Re-evaluated:Patient Re-evaluated prior to induction Oxygen Delivery Method: Circle system utilized Preoxygenation: Pre-oxygenation with 100% oxygen Induction Type: IV induction Ventilation: Mask ventilation without difficulty Laryngoscope Size: Mac and 4 Grade View: Grade I Tube type: Oral Tube size: 7.5 mm Number of attempts: 1 Airway Equipment and Method: Stylet Placement Confirmation: ETT inserted through vocal cords under direct vision,  positive ETCO2 and breath sounds checked- equal and bilateral Secured at: 23 cm Tube secured with: Tape Dental Injury: Teeth and Oropharynx as per pre-operative assessment  Comments: Performed by Sueanne Margarita, SRNA

## 2020-11-19 NOTE — Discharge Instructions (Signed)
[facility] _______[facility] °INGUINAL HERNIA REPAIR: POST OP INSTRUCTIONS ° °Always review your discharge instruction sheet given to you by the facility where your surgery was performed. °IF YOU HAVE DISABILITY OR FAMILY LEAVE FORMS, YOU MUST BRING THEM TO THE OFFICE FOR PROCESSING.   °DO NOT GIVE THEM TO YOUR DOCTOR. ° °1. A  prescription for pain medication may be given to you upon discharge.  Take your pain medication as prescribed, if needed.  If narcotic pain medicine is not needed, then you may take acetaminophen (Tylenol) or ibuprofen (Advil) as needed. °2. Take your usually prescribed medications unless otherwise directed. °If you need a refill on your pain medication, please contact your pharmacy.  They will contact our office to request authorization. Prescriptions will not be filled after 5 pm or on week-ends. °3. You should follow a light diet the first 24 hours after arrival home, such as soup and crackers, etc.  Be sure to include lots of fluids daily.  Resume your normal diet the day after surgery. °4.Most patients will experience some swelling and bruising around the umbilicus or in the groin and scrotum.  Ice packs and reclining will help.  Swelling and bruising can take several days to resolve.  °6. It is common to experience some constipation if taking pain medication after surgery.  Increasing fluid intake and taking a stool softener (such as Colace) will usually help or prevent this problem from occurring.  A mild laxative (Milk of Magnesia or Miralax) should be taken according to package directions if there are no bowel movements after 48 hours. °7. Unless discharge instructions indicate otherwise, you may remove your bandages 24-48 hours after surgery, and you may shower at that time.  You may have steri-strips (small skin tapes) in place directly over the incision.  These strips should be left on the skin for 7-10 days.  If your surgeon used skin glue on the incision, you may  shower in 24 hours.  The glue will flake off over the next 2-3 weeks.  Any sutures or staples will be removed at the office during your follow-up visit. °8. ACTIVITIES:  You may resume regular (light) daily activities beginning the next day--such as daily self-care, walking, climbing stairs--gradually increasing activities as tolerated.  You may have sexual intercourse when it is comfortable.  Refrain from any heavy lifting or straining until approved by your doctor. ° °a.You may drive when you are no longer taking prescription pain medication, you can comfortably wear a seatbelt, and you can safely maneuver your car and apply brakes. °b.RETURN TO WORK:   °_____________________________________________ ° °9.You should see your doctor in the office for a follow-up appointment approximately 2-3 weeks after your surgery.  Make sure that you call for this appointment within a day or two after you arrive home to insure a convenient appointment time. °10.OTHER INSTRUCTIONS: _________________________ °   _____________________________________ ° °WHEN TO CALL YOUR DOCTOR: °1. Fever over 101.0 °2. Inability to urinate °3. Nausea and/or vomiting °4. Extreme swelling or bruising °5. Continued bleeding from incision. °6. Increased pain, redness, or drainage from the incision ° °The clinic staff is available to answer your questions during regular business hours.  Please don’t hesitate to call and ask to speak to one of the nurses for clinical concerns.  If you have a medical emergency, go to the nearest emergency room or call 911.  A surgeon from [facility] is always on call at the hospital ° ° °[address], [address], [place], [place]  [address] ? ° [address] [address], [place],    [address] °[phone] ? [phone] ? FAX [phone] °Web site: [email or web address] ° °

## 2020-11-19 NOTE — Transfer of Care (Signed)
Immediate Anesthesia Transfer of Care Note  Patient: Jordan [name]  Procedure(s) Performed: XI ROBOTIC ASSISTED BILATERAL INGUINAL HERNIA REPAIR WITH MESH (Bilateral Inguinal) INSERTION OF MESH (Bilateral Inguinal)  Patient Location: PACU  Anesthesia Type:General  Level of Consciousness: drowsy and patient cooperative  Airway & Oxygen Therapy: Patient Spontanous Breathing and Patient connected to nasal cannula oxygen  Post-op Assessment: Report given to RN and Post -op Vital signs reviewed and stable  Post vital signs: Reviewed and stable  Last Vitals:  Vitals Value Taken Time  BP 126/82 11/19/20 1412  Temp    Pulse 69 11/19/20 1413  Resp 20 11/19/20 1413  SpO2 100 % 11/19/20 1413  Vitals shown include unvalidated device data.  Last Pain:  Vitals:   11/19/20 1136  TempSrc:   PainSc: 0-No pain         Complications: No complications documented.

## 2020-11-19 NOTE — Anesthesia Postprocedure Evaluation (Signed)
Anesthesia Post Note  Patient: Jordan [name]  Procedure(s) Performed: XI ROBOTIC ASSISTED BILATERAL INGUINAL HERNIA REPAIR WITH MESH (Bilateral Inguinal) INSERTION OF MESH (Bilateral Inguinal)     Patient location during evaluation: PACU Anesthesia Type: General Level of consciousness: sedated Pain management: pain level controlled Vital Signs Assessment: post-procedure vital signs reviewed and stable Respiratory status: spontaneous breathing and respiratory function stable Cardiovascular status: stable Postop Assessment: no apparent nausea or vomiting Anesthetic complications: no   No complications documented.  Last Vitals:  Vitals:   11/19/20 1445 11/19/20 1500  BP: (!) 132/101 (!) 125/91  Pulse: 78 71  Resp: 12 10  Temp:  36.8 C  SpO2: 99% 100%    Last Pain:  Vitals:   11/19/20 1445  TempSrc:   PainSc: 3                  Adrik Khim DANIEL

## 2020-11-19 NOTE — Anesthesia Preprocedure Evaluation (Addendum)
Anesthesia Evaluation  Patient identified by MRN, date of birth, ID band Patient awake    Reviewed: Allergy & Precautions, NPO status , Patient's Chart, lab work & pertinent test results  History of Anesthesia Complications Negative for: history of anesthetic complications  Airway Mallampati: II  TM Distance: >3 FB Neck ROM: Full    Dental  (+) Poor Dentition, Loose, Dental Advisory Given   Pulmonary asthma , former smoker,    Pulmonary exam normal        Cardiovascular negative cardio ROS Normal cardiovascular exam     Neuro/Psych negative neurological ROS     GI/Hepatic Neg liver ROS,   Endo/Other  negative endocrine ROS  Renal/GU negative Renal ROSProstate Ca     Musculoskeletal negative musculoskeletal ROS (+)   Abdominal   Peds  Hematology negative hematology ROS (+)   Anesthesia Other Findings   Reproductive/Obstetrics                            Anesthesia Physical Anesthesia Plan  ASA: II  Anesthesia Plan: General   Post-op Pain Management:    Induction: Intravenous  PONV Risk Score and Plan: 4 or greater and Ondansetron, Dexamethasone, Midazolam and Treatment may vary due to age or medical condition  Airway Management Planned: Oral ETT  Additional Equipment:   Intra-op Plan:   Post-operative Plan: Extubation in OR  Informed Consent: I have reviewed the patients History and Physical, chart, labs and discussed the procedure including the risks, benefits and alternatives for the proposed anesthesia with the patient or authorized representative who has indicated his/her understanding and acceptance.     Dental advisory given  Plan Discussed with: Anesthesiologist, CRNA and Surgeon  Anesthesia Plan Comments:        Anesthesia Quick Evaluation

## 2020-11-19 NOTE — Interval H&P Note (Signed)
History and Physical Interval Note:  11/19/2020 12:09 PM  Jordan [name]  has presented today for surgery, with the diagnosis of Andrews.  The various methods of treatment have been discussed with the patient and family. After consideration of risks, benefits and other options for treatment, the patient has consented to  Procedure(s): XI ROBOTIC [place] (Bilateral) as a surgical intervention.  The patient's history has been reviewed, patient examined, no change in status, stable for surgery.  I have reviewed the patient's chart and labs.  Questions were answered to the patient's satisfaction.     Ralene Ok

## 2020-11-19 NOTE — Op Note (Signed)
11/19/2020  1:56 PM  PATIENT:  Claudia Desanctis  54 y.o. male  PRE-OPERATIVE DIAGNOSIS:  BILATERAL INGUINAL HERNIAS  POST-OPERATIVE DIAGNOSIS:  BILATERAL INDIRECT INGUINAL HERNIAS  PROCEDURE:  Procedure(s): XI ROBOTIC ASSISTED BILATERAL INGUINAL HERNIA REPAIR WITH MESH (Bilateral) INSERTION OF MESH (Bilateral)  SURGEON:  Surgeon(s) and Role:    Ralene Ok, MD - Primary  ASSISTANTS: Pryor Curia, RNFA   ANESTHESIA:   local and general  EBL:  20 mL   BLOOD ADMINISTERED:none  DRAINS: none   LOCAL MEDICATIONS USED:  BUPIVICAINE  and OTHER exparil  SPECIMEN:  No Specimen  DISPOSITION OF SPECIMEN:  none  COUNTS:  YES  TOURNIQUET:  * No tourniquets in log *  DICTATION: .Dragon Dictation The patient was taken back to the operating room. The patient was placed in supine position with bilateral SCDs in place. The patient was prepped and draped in the usual sterile fashion.  After appropriate anitbiotics were confirmed, a time-out was confirmed and all facts were verified.  At this time a Veress needle technique was used to inspect the abdomen approximately 10 cm from the umbilicus in the  Midclavicular line. This time a 12 mm robotic trocar was placed into the abdomen. The camera was placed there was no injury to any intra-abdominal organs. A 68mm umbilical port was placed just superior to the umbilicus. An 8 mm port was placed approximately 10 cm lateral to the umbilicus on the left paramedian side.  Robot was positioned over the patient and the ports were docked in the usual fashion.  At this time the left-sided peritoneum was taken down from the medial umbilical ligament laterally. The pre-peritoneal space was entered. Dissection was taken down to Cooper's ligament. At this time it was apparent there was a large indirect hernia with a sliding portion of the sigmoid colon into the hernia.  At this time I proceeded clean out the rest Cooper's ligament and the medial to  lateral direction. I proceeded laterally to dissect the spermatic cord. The spermatic cord was circumferentially dissected away from the surrounding musculature and tissue. The vas deferens was identified and protected all portions of the case. There was a large indirect hernia at the base of the spermatic cord. This was dissected back.  At this time I proceeded to create a pocket laterally for the mesh. Once the pocket was created the peritoneum was stripped back to approximately the base of the cord.  At this time I turned my attention to the right side.  The peritoneum was taken down from the medial umbilical ligament laterally. The pre-peritoneal space was entered. Dissection was taken down to Cooper's ligament. At this time it was apparent there was a large indirect hernia and cord lipoma.  At this time proceeded clean out the rest Cooper's ligament and the medial to lateral direction. I proceeded laterally to dissect the spermatic cord. The spermatic cord was circumferentially dissected away from the surrounding musculature and tissue. The vas deferens was identified and protected all portions of the case. There was a moderate indirect hernia at the base of the spermatic cord. This was dissected back.  There was a small cord lipoma associated with the cord. At this time I proceeded to create a pocket laterally for the mesh. Once the pocket was created the peritoneum was stripped back to approximately the base of the cord.   At this time the piece of 16x12cm progrip mesh x2 was in placed into each of  the dissected areas. This  covered both the direct and indirect spaces appropriately. This also covered  The femoral space. The mesh lay flat from medial to lateral.   At this time a 2-0 V- lock stitch was used to close the peritoneum in a [place] running fashion.  At this time the robot was undocked. The umbilical port site was reapproximated using a 0 Vicryl via an Endo Close device 1. All ports were  removed. The skin was reapproximated and all port sites using 4-0 Monocryl subcuticular fashion.  The patient the procedure well was taken to the recovery     PLAN OF CARE: Discharge to home after PACU  PATIENT DISPOSITION:  PACU - hemodynamically stable.   Delay start of Pharmacological VTE agent (>24hrs) due to surgical blood loss or risk of bleeding: not applicable

## 2020-11-20 ENCOUNTER — Encounter (HOSPITAL_COMMUNITY): Payer: Self-pay | Admitting: General Surgery

## 2021-09-08 IMAGING — NM NM BONE WHOLE BODY
2 series · 2 of 2 positions shown · non-contrast
Comparison: CT abdomen and pelvis of 01/16/2020

CLINICAL DATA: History of prostate cancer.

EXAM:
NUCLEAR MEDICINE WHOLE BODY BONE SCAN
TECHNIQUE: Whole body anterior and posterior images were obtained approximately
3 hours after intravenous injection of radiopharmaceutical.
RADIOPHARMACEUTICALS:  Twenty-one mCi 6echnetium-VVm MDP IV

[Series 1: wbr_bone_40 whole body · 2.66mm/px · 1 of 1 slices shown (1 of 2)]
[im 1/1]
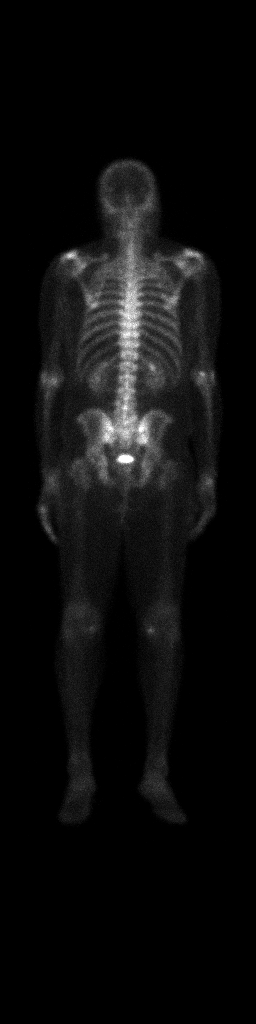

[Series 1: wbr_bone_40 whole body · 2.66mm/px · 1 of 1 slices shown (2 of 2)]
[im 1/1]
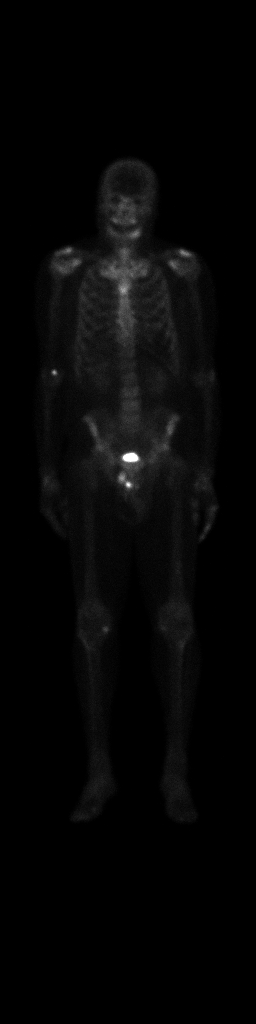

[2 of 2 positions shown; findings below may reference images not displayed]

FINDINGS: Evidence of expected renal uptake and excretion.

Signs of glenohumeral degenerative change, did generative changes
and wrists shoulders and knees. No scintigraphic evidence of bony
metastatic disease.
IMPRESSION: 1. No signs of osseous metastasis.

## 2023-06-17 ENCOUNTER — Other Ambulatory Visit: Payer: Self-pay | Admitting: Urology

## 2023-06-23 ENCOUNTER — Encounter (HOSPITAL_BASED_OUTPATIENT_CLINIC_OR_DEPARTMENT_OTHER): Payer: Self-pay | Admitting: Urology

## 2023-06-23 NOTE — Progress Notes (Signed)
Spoke w/ via phone for pre-op interview--- Shirlee More Lab needs dos---- NONE              Lab results------ COVID test -----patient states asymptomatic no test needed Arrive at -------1015 NPO after MN NO Solid Food.  Clear liquids from MN until---0915 Med rec completed Medications to take morning of surgery -----Symbicort, bring Albuterol inhaler. Diabetic medication ----- Patient instructed no nail polish to be worn day of surgery Patient instructed to bring photo id and insurance card day of surgery Patient aware to have Driver (ride ) / caregiver  Mother Dovid Bartko  for 24 hours after surgery  Patient Special Instructions ----- Pre-Op special Instructions ----- Patient verbalized understanding of instructions that were given at this phone interview. Patient denies shortness of breath, chest pain, fever, cough at this phone interview.

## 2023-07-19 ENCOUNTER — Other Ambulatory Visit: Payer: Self-pay | Admitting: Urology

## 2023-08-10 ENCOUNTER — Encounter (HOSPITAL_BASED_OUTPATIENT_CLINIC_OR_DEPARTMENT_OTHER): Payer: Self-pay | Admitting: Urology

## 2023-08-10 ENCOUNTER — Other Ambulatory Visit: Payer: Self-pay

## 2023-08-10 NOTE — Progress Notes (Signed)
Spoke w/ via phone for pre-op interview---pt Lab needs dos----    none           Lab results------none COVID test -----patient states asymptomatic no test needed Arrive at -------1115 am 08-20-2023 NPO after MN NO Solid Food.  Clear liquids from MN until---1015 Med rec completed Medications to take morning of surgery -----wixela inhaler, useprn/bring albuterol inhaler Diabetic medication -----n/a Patient instructed no nail polish to be worn day of surgery Patient instructed to bring photo id and insurance card day of surgery Patient aware to have Driver (ride ) / caregiver   mother Neurosurgeon  for 24 hours after surgery  Patient Special Instructions -----none Pre-Op special Instructions -----none Patient verbalized understanding of instructions that were given at this phone interview. Patient denies shortness of breath, chest pain, fever, cough at this phone interview.

## 2023-08-20 ENCOUNTER — Encounter (HOSPITAL_BASED_OUTPATIENT_CLINIC_OR_DEPARTMENT_OTHER)
Admission: RE | Disposition: A | Discharge status: Home / Self Care | Payer: Self-pay | Source: Home / Self Care | Attending: Urology

## 2023-08-20 ENCOUNTER — Encounter (HOSPITAL_BASED_OUTPATIENT_CLINIC_OR_DEPARTMENT_OTHER): Payer: Self-pay

## 2023-08-20 ENCOUNTER — Other Ambulatory Visit: Payer: Self-pay

## 2023-08-20 ENCOUNTER — Encounter (HOSPITAL_BASED_OUTPATIENT_CLINIC_OR_DEPARTMENT_OTHER): Payer: Self-pay | Admitting: Urology

## 2023-08-20 ENCOUNTER — Encounter (HOSPITAL_BASED_OUTPATIENT_CLINIC_OR_DEPARTMENT_OTHER): Payer: Commercial Managed Care - PPO

## 2023-08-20 ENCOUNTER — Ambulatory Visit (HOSPITAL_BASED_OUTPATIENT_CLINIC_OR_DEPARTMENT_OTHER)
Admission: RE | Admit: 2023-08-20 | Discharge: 2023-08-20 | Disposition: A | Discharge status: Home / Self Care | Payer: Commercial Managed Care - PPO | Attending: Urology | Admitting: Urology

## 2023-08-20 DIAGNOSIS — Q5522 Retractile testis: Secondary | ICD-10-CM | POA: Diagnosis not present

## 2023-08-20 DIAGNOSIS — Z9889 Other specified postprocedural states: Secondary | ICD-10-CM | POA: Diagnosis not present

## 2023-08-20 DIAGNOSIS — Z8042 Family history of malignant neoplasm of prostate: Secondary | ICD-10-CM | POA: Insufficient documentation

## 2023-08-20 DIAGNOSIS — J4540 Moderate persistent asthma, uncomplicated: Secondary | ICD-10-CM | POA: Insufficient documentation

## 2023-08-20 DIAGNOSIS — C61 Malignant neoplasm of prostate: Secondary | ICD-10-CM | POA: Insufficient documentation

## 2023-08-20 DIAGNOSIS — Z87891 Personal history of nicotine dependence: Secondary | ICD-10-CM | POA: Diagnosis not present

## 2023-08-20 DIAGNOSIS — Z8546 Personal history of malignant neoplasm of prostate: Secondary | ICD-10-CM | POA: Diagnosis not present

## 2023-08-20 DIAGNOSIS — N433 Hydrocele, unspecified: Secondary | ICD-10-CM | POA: Diagnosis not present

## 2023-08-20 HISTORY — PX: ORCHIOPEXY: SHX479

## 2023-08-20 HISTORY — PX: HYDROCELE EXCISION: SHX482

## 2023-08-20 SURGERY — HYDROCELECTOMY
Anesthesia: General | Laterality: Bilateral

## 2023-08-20 MED ORDER — PROPOFOL 10 MG/ML IV BOLUS
INTRAVENOUS | Status: AC
  Filled 2023-08-20: qty 20

## 2023-08-20 MED ORDER — DEXAMETHASONE SODIUM PHOSPHATE 10 MG/ML IJ SOLN
INTRAMUSCULAR | Status: DC | PRN
  Administered 2023-08-20: 5 mg via INTRAVENOUS

## 2023-08-20 MED ORDER — PHENYLEPHRINE 80 MCG/ML (10ML) SYRINGE FOR IV PUSH (FOR BLOOD PRESSURE SUPPORT)
PREFILLED_SYRINGE | INTRAVENOUS | Status: DC | PRN
  Administered 2023-08-20: 160 ug via INTRAVENOUS
  Administered 2023-08-20: 80 ug via INTRAVENOUS
  Administered 2023-08-20 (×2): 160 ug via INTRAVENOUS

## 2023-08-20 MED ORDER — OXYCODONE HCL 5 MG/5ML PO SOLN: 5.0000 mg | Freq: Once | ORAL | Status: DC | PRN

## 2023-08-20 MED ORDER — 0.9 % SODIUM CHLORIDE (POUR BTL) OPTIME
TOPICAL | Status: DC | PRN
  Administered 2023-08-20: 500 mL

## 2023-08-20 MED ORDER — ONDANSETRON HCL 4 MG/2ML IJ SOLN
INTRAMUSCULAR | Status: DC | PRN
  Administered 2023-08-20: 4 mg via INTRAVENOUS

## 2023-08-20 MED ORDER — MIDAZOLAM HCL 2 MG/2ML IJ SOLN
INTRAMUSCULAR | Status: AC
  Filled 2023-08-20: qty 2

## 2023-08-20 MED ORDER — FENTANYL CITRATE (PF) 100 MCG/2ML IJ SOLN
INTRAMUSCULAR | Status: AC
  Filled 2023-08-20: qty 2

## 2023-08-20 MED ORDER — BUPIVACAINE HCL (PF) 0.25 % IJ SOLN
INTRAMUSCULAR | Status: DC | PRN
  Administered 2023-08-20: 30 mL

## 2023-08-20 MED ORDER — CEFAZOLIN SODIUM-DEXTROSE 2-4 GM/100ML-% IV SOLN
2.0000 g | INTRAVENOUS | Status: AC
  Administered 2023-08-20: 2 g via INTRAVENOUS

## 2023-08-20 MED ORDER — DROPERIDOL 2.5 MG/ML IJ SOLN: 0.6250 mg | Freq: Once | INTRAMUSCULAR | Status: DC | PRN

## 2023-08-20 MED ORDER — CEFAZOLIN SODIUM-DEXTROSE 2-4 GM/100ML-% IV SOLN
INTRAVENOUS | Status: AC
  Filled 2023-08-20: qty 100

## 2023-08-20 MED ORDER — OXYCODONE-ACETAMINOPHEN 5-325 MG PO TABS: 1.0000 | ORAL_TABLET | Freq: Four times a day (QID) | ORAL | 0 refills | Status: AC | PRN

## 2023-08-20 MED ORDER — ONDANSETRON HCL 4 MG/2ML IJ SOLN
INTRAMUSCULAR | Status: AC
  Filled 2023-08-20: qty 4

## 2023-08-20 MED ORDER — GLYCOPYRROLATE PF 0.2 MG/ML IJ SOSY
PREFILLED_SYRINGE | INTRAMUSCULAR | Status: DC | PRN
  Administered 2023-08-20: .2 mg via INTRAVENOUS

## 2023-08-20 MED ORDER — DEXMEDETOMIDINE HCL IN NACL 80 MCG/20ML IV SOLN
INTRAVENOUS | Status: DC | PRN
  Administered 2023-08-20: 8 ug via INTRAVENOUS

## 2023-08-20 MED ORDER — FENTANYL CITRATE (PF) 100 MCG/2ML IJ SOLN
INTRAMUSCULAR | Status: DC | PRN
  Administered 2023-08-20 (×2): 50 ug via INTRAVENOUS

## 2023-08-20 MED ORDER — FENTANYL CITRATE (PF) 100 MCG/2ML IJ SOLN: 25.0000 ug | INTRAMUSCULAR | Status: DC | PRN

## 2023-08-20 MED ORDER — PROPOFOL 10 MG/ML IV BOLUS
INTRAVENOUS | Status: DC | PRN
  Administered 2023-08-20: 50 mg via INTRAVENOUS
  Administered 2023-08-20: 200 mg via INTRAVENOUS
  Administered 2023-08-20: 50 mg via INTRAVENOUS

## 2023-08-20 MED ORDER — ONDANSETRON HCL 4 MG/2ML IJ SOLN
INTRAMUSCULAR | Status: AC
  Filled 2023-08-20: qty 2

## 2023-08-20 MED ORDER — OXYCODONE HCL 5 MG PO TABS: 5.0000 mg | ORAL_TABLET | Freq: Once | ORAL | Status: DC | PRN

## 2023-08-20 MED ORDER — PHENYLEPHRINE 80 MCG/ML (10ML) SYRINGE FOR IV PUSH (FOR BLOOD PRESSURE SUPPORT)
PREFILLED_SYRINGE | INTRAVENOUS | Status: AC
  Filled 2023-08-20: qty 10

## 2023-08-20 MED ORDER — MIDAZOLAM HCL 5 MG/5ML IJ SOLN
INTRAMUSCULAR | Status: DC | PRN
  Administered 2023-08-20: 2 mg via INTRAVENOUS

## 2023-08-20 MED ORDER — DEXAMETHASONE SODIUM PHOSPHATE 10 MG/ML IJ SOLN
INTRAMUSCULAR | Status: AC
  Filled 2023-08-20: qty 1

## 2023-08-20 MED ORDER — LACTATED RINGERS IV SOLN: INTRAVENOUS | Status: DC

## 2023-08-20 MED ORDER — LIDOCAINE 2% (20 MG/ML) 5 ML SYRINGE
INTRAMUSCULAR | Status: DC | PRN
  Administered 2023-08-20: 100 mg via INTRAVENOUS

## 2023-08-20 MED ORDER — ACETAMINOPHEN 10 MG/ML IV SOLN: 1000.0000 mg | Freq: Once | INTRAVENOUS | Status: DC | PRN

## 2023-08-20 MED ORDER — SENNOSIDES-DOCUSATE SODIUM 8.6-50 MG PO TABS: 1.0000 | ORAL_TABLET | Freq: Two times a day (BID) | ORAL | 0 refills | Status: AC

## 2023-08-20 SURGICAL SUPPLY — 43 items
ADH SKN CLS APL DERMABOND .7 (GAUZE/BANDAGES/DRESSINGS)
BLADE CLIPPER SENSICLIP SURGIC (BLADE) ×1 IMPLANT
BLADE SURG 15 STRL LF DISP TIS (BLADE) ×1 IMPLANT
BLADE SURG 15 STRL SS (BLADE) ×1
BNDG GAUZE DERMACEA FLUFF 4 (GAUZE/BANDAGES/DRESSINGS) ×1 IMPLANT
BNDG GZE DERMACEA 4 6PLY (GAUZE/BANDAGES/DRESSINGS) ×1
BRIEF MESH DISP LRG (UNDERPADS AND DIAPERS) ×1 IMPLANT
COVER BACK TABLE 60X90IN (DRAPES) ×1 IMPLANT
COVER MAYO STAND STRL (DRAPES) ×1 IMPLANT
DERMABOND ADVANCED .7 DNX12 (GAUZE/BANDAGES/DRESSINGS) IMPLANT
DISSECTOR ROUND CHERRY 3/8 STR (MISCELLANEOUS) IMPLANT
DRAIN PENROSE 0.25X18 (DRAIN) ×1 IMPLANT
DRAPE LAPAROTOMY 100X72 PEDS (DRAPES) ×1 IMPLANT
DRSG TEGADERM 4X4.75 (GAUZE/BANDAGES/DRESSINGS) IMPLANT
ELECT REM PT RETURN 9FT ADLT (ELECTROSURGICAL) ×1
ELECTRODE REM PT RTRN 9FT ADLT (ELECTROSURGICAL) ×1 IMPLANT
GAUZE 4X4 16PLY ~~LOC~~+RFID DBL (SPONGE) ×1 IMPLANT
GLOVE BIO SURGEON STRL SZ7.5 (GLOVE) ×1 IMPLANT
GOWN STRL REUS W/TWL LRG LVL3 (GOWN DISPOSABLE) ×1 IMPLANT
KIT TURNOVER CYSTO (KITS) ×1 IMPLANT
NDL HYPO 25X1 1.5 SAFETY (NEEDLE) ×1 IMPLANT
NEEDLE HYPO 25X1 1.5 SAFETY (NEEDLE) ×1 IMPLANT
NS IRRIG 500ML POUR BTL (IV SOLUTION) IMPLANT
PACK BASIN DAY SURGERY FS (CUSTOM PROCEDURE TRAY) ×1 IMPLANT
PENCIL SMOKE EVACUATOR (MISCELLANEOUS) ×1 IMPLANT
SLEEVE SCD COMPRESS KNEE MED (STOCKING) ×1 IMPLANT
SUT ETHILON 3 0 PS 1 (SUTURE) IMPLANT
SUT ETHILON 4 0 PS 2 18 (SUTURE) IMPLANT
SUT MNCRL AB 4-0 PS2 18 (SUTURE) ×1 IMPLANT
SUT PROLENE 4 0 PS 2 18 (SUTURE) ×1 IMPLANT
SUT VIC AB 2-0 SH 27 (SUTURE)
SUT VIC AB 2-0 SH 27XBRD (SUTURE) IMPLANT
SUT VIC AB 3-0 SH 27 (SUTURE) ×4
SUT VIC AB 3-0 SH 27X BRD (SUTURE) IMPLANT
SUT VIC AB 3-0 SH 27XBRD (SUTURE) ×1 IMPLANT
SUT VIC AB 4-0 SH 27 (SUTURE) ×1
SUT VIC AB 4-0 SH 27XANBCTRL (SUTURE) ×1 IMPLANT
SYR CONTROL 10ML LL (SYRINGE) ×1 IMPLANT
TOWEL OR 17X24 6PK STRL BLUE (TOWEL DISPOSABLE) ×1 IMPLANT
TRAY DSU PREP LF (CUSTOM PROCEDURE TRAY) ×1 IMPLANT
TUBE CONNECTING 12X1/4 (SUCTIONS) ×1 IMPLANT
WATER STERILE IRR 500ML POUR (IV SOLUTION) IMPLANT
YANKAUER SUCT BULB TIP NO VENT (SUCTIONS) ×1 IMPLANT

## 2023-08-20 NOTE — Discharge Instructions (Addendum)
1 - You may have small blood-tinged drainage from drain area. This is normal.No straddle activity / heavy lifting x 2 weeks. OK to shower at anytime.   2 - Call MD or go to ER for fever >102, severe pain / nausea / vomiting not relieved by medications, or acute change in medical status             Post Anesthesia Home Care Instructions  Activity: Get plenty of rest for the remainder of the day. A responsible individual must stay with you for 24 hours following the procedure.  For the next 24 hours, DO NOT: -Drive a car -Advertising copywriter -Drink alcoholic beverages -Take any medication unless instructed by your physician -Make any legal decisions or sign important papers.  Meals: Start with liquid foods such as gelatin or soup. Progress to regular foods as tolerated. Avoid greasy, spicy, heavy foods. If nausea and/or vomiting occur, drink only clear liquids until the nausea and/or vomiting subsides. Call your physician if vomiting continues.  Special Instructions/Symptoms: Your throat may feel dry or sore from the anesthesia or the breathing tube placed in your throat during surgery. If this causes discomfort, gargle with warm salt water. The discomfort should disappear within 24 hours.

## 2023-08-20 NOTE — Anesthesia Preprocedure Evaluation (Addendum)
Anesthesia Evaluation  Patient identified by MRN, date of birth, ID band Patient awake    Reviewed: Allergy & Precautions, H&P , NPO status , Patient's Chart, lab work & pertinent test results  Airway Mallampati: II  TM Distance: >3 FB Neck ROM: Full    Dental  (+) Missing, Teeth Intact,    Pulmonary asthma , former smoker   Pulmonary exam normal breath sounds clear to auscultation       Cardiovascular negative cardio ROS Normal cardiovascular exam Rhythm:Regular Rate:Normal     Neuro/Psych negative neurological ROS  negative psych ROS   GI/Hepatic negative GI ROS, Neg liver ROS,,,  Endo/Other  negative endocrine ROS    Renal/GU negative Renal ROS   Prostate cancer    Musculoskeletal negative musculoskeletal ROS (+)    Abdominal   Peds negative pediatric ROS (+)  Hematology negative hematology ROS (+)   Anesthesia Other Findings   Reproductive/Obstetrics negative OB ROS                             Anesthesia Physical Anesthesia Plan  ASA: 2  Anesthesia Plan: General   Post-op Pain Management:    Induction: Intravenous  PONV Risk Score and Plan: Ondansetron and Dexamethasone  Airway Management Planned: LMA  Additional Equipment:   Intra-op Plan:   Post-operative Plan: Extubation in OR  Informed Consent: I have reviewed the patients History and Physical, chart, labs and discussed the procedure including the risks, benefits and alternatives for the proposed anesthesia with the patient or authorized representative who has indicated his/her understanding and acceptance.     Dental advisory given  Plan Discussed with: CRNA  Anesthesia Plan Comments:        Anesthesia Quick Evaluation

## 2023-08-20 NOTE — Brief Op Note (Signed)
08/20/2023  2:49 PM  PATIENT:  Jordan [name]  57 y.o. male  PRE-OPERATIVE DIAGNOSIS:  VERY LARGE BILATERAL HYDROCELES  POST-OPERATIVE DIAGNOSIS:  VERY LARGE BILATERAL HYDROCELES  PROCEDURE:  Procedure(s) with comments: HYDROCELECTOMY ADULT (Bilateral) - 1 HR ORCHIOPEXY ADULT (Bilateral)  SURGEON:  Surgeons and Role:    * Marysol Wellnitz, Delbert Phenix., MD - Primary  PHYSICIAN ASSISTANT:   ASSISTANTS: none   ANESTHESIA:   local and general  EBL:  10 mL   BLOOD ADMINISTERED:none  DRAINS: Penrose drain in the dependant scrotum    LOCAL MEDICATIONS USED:  MARCAINE     SPECIMEN:  Source of Specimen:  redundant scrotal skin and hydrocele sac  DISPOSITION OF SPECIMEN:   discard  COUNTS:  YES  TOURNIQUET:  * No tourniquets in log *  DICTATION: .Other Dictation: Dictation Number [id number]  PLAN OF CARE: Discharge to home after PACU  PATIENT DISPOSITION:  PACU - hemodynamically stable.   Delay start of Pharmacological VTE agent (>24hrs) due to surgical blood loss or risk of bleeding: yes

## 2023-08-20 NOTE — Transfer of Care (Signed)
Immediate Anesthesia Transfer of Care Note  Patient: Jordan [name]  Procedure(s) Performed: HYDROCELECTOMY ADULT (Bilateral) ORCHIOPEXY ADULT (Bilateral)  Patient Location: PACU  Anesthesia Type:General  Level of Consciousness: drowsy, patient cooperative, and responds to stimulation  Airway & Oxygen Therapy: Patient Spontanous Breathing  Post-op Assessment: Report given to RN and Post -op Vital signs reviewed and stable  Post vital signs: Reviewed and stable  Last Vitals:  Vitals Value Taken Time  BP 132/97 08/20/23 1450  Temp    Pulse 72 08/20/23 1452  Resp 13 08/20/23 1452  SpO2 98 % 08/20/23 1452  Vitals shown include unfiled device data.  Last Pain:  Vitals:   08/20/23 1110  TempSrc: Oral  PainSc: 0-No pain      Patients Stated Pain Goal: 4 (08/20/23 1110)  Complications: No notable events documented.

## 2023-08-20 NOTE — H&P (Signed)
Jordan [name] is an 57 y.o. male.    Chief Complaint: Pre-Op BILATERAL Hydrocelectomy / Orchidopexy  HPI:   1 - Moderate Risk Prostate Cancer - Pt's brother died of prostate cancer. 08-18-17 3+4=7 in 50% of LMA and Gleason 6 disease 5% of LMM< and RLM. TRUS 38mL w/o median lobe. PSA 6.03 ==> primary brachytherapy + SpaceOAR 10/2017 under care of Drs Kathrynn Running and [facility]. ? ?Summarized Post Brachy Course: ? 12/2019 - NEGATIVE TRUS BX 50 mL, no median lobe for PSA 3.5 ? 02/2022 - PSA 0.2  ?04/2023 - PSA .056 ?  ?2 - Bilateral Hydroceles - new bilateral hydroceles after hernia repair 2021. Estimate vol each. No prior scrotal imaging. These have worsened over time, now estimate Rt side atbou 2024, becoming more botherseom. Exam very sensitive as thin and no recurrent hernias appreciated. ?   ?PMH sig for asthma, bilateral inguinal hernia repair 2022. No ischemic CV disease / blood thinners. 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 His PCP is Renford Dills MD with [facility]. ?  ?Today " Jordan [name] " is seen to proceed with BILATERAL hydrocelectomy. No interval fevers.     Past Medical History:  Diagnosis Date   Asthma, well controlled, moderate persistent    per pt followed by pcp, dr polite, stated controlled w/ symbicort inhaler (as of 11-22-2017 denies upper airway infection in past month) (in epic documented pumoloigst , dr Sherene Sires , Theron Arista 2012)   Prostate cancer [facility]) urologist-  dr Berneice Heinrich  oncologist-  dr Kathrynn Running   dx 08-16-2017 (bx)-- Stage T1c, Gleason 3+4, PSA 60.3, vol 38cc-- scheduled for radiactive seed implants 11-25-2017    Past Surgical History:  Procedure Laterality Date   CYSTOSCOPY N/A 11/25/2017   Procedure: CYSTOSCOPY;  Surgeon: Sebastian Ache, MD;  Location: [facility];  Service: Urology;  Laterality: N/A;  No seeds noted in bladder   INSERTION OF MESH Bilateral 11/19/2020    Procedure: INSERTION OF MESH;  Surgeon: Axel Filler, MD;  Location: [facility] OR;  Service: General;  Laterality: Bilateral;   PROSTATE BIOPSY  08-16-2017  office   RADIOACTIVE SEED IMPLANT N/A 11/25/2017   Procedure: RADIOACTIVE SEED IMPLANT/BRACHYTHERAPY IMPLANT;  Surgeon: Sebastian Ache, MD;  Location: [facility] ;  Service: Urology;  Laterality: N/A;   seeds implanted   SPACE OAR INSTILLATION N/A 11/25/2017   Procedure: SPACE OAR INSTILLATION;  Surgeon: Sebastian Ache, MD;  Location: [facility];  Service: Urology;  Laterality: N/A;   XI ROBOTIC ASSISTED INGUINAL HERNIA REPAIR WITH MESH Bilateral 11/19/2020   Procedure: XI ROBOTIC ASSISTED BILATERAL INGUINAL HERNIA REPAIR WITH MESH;  Surgeon: Axel Filler, MD;  Location: [facility] OR;  Service: General;  Laterality: Bilateral;    Family History  Problem Relation Age of Onset   Asthma Maternal Aunt    Cancer Mother        breast   Cancer Father        prostate   Cancer Sister        unknown/"cancer of blood"   Cancer Brother        prostate   Cancer Maternal Uncle        prostate   Cancer Paternal Aunt        unknown   Cancer Cousin        lung   Social History:  reports that he has quit smoking. He has never used smokeless  tobacco. He reports that he does not currently use alcohol. He reports that he does not use drugs.  Allergies:  Allergies  Allergen Reactions   Bee Pollen     Asthma attack   Chocolate     Astmla attack    No medications prior to admission.    No results found for this or any previous visit (from the past 48 hour(s)). No results found.  Review of Systems  Constitutional:  Negative for chills and fever.  Genitourinary:  Positive for scrotal swelling.  All other systems reviewed and are negative.   Height 5' 9.5" (1.765 m), weight 79.4 kg. Physical Exam Vitals reviewed.  HENT:     Head: Normocephalic.  Eyes:     Pupils: Pupils are equal, round, and reactive to  light.  Pulmonary:     Effort: Pulmonary effort is normal.  Abdominal:     General: Abdomen is flat.     Comments: Prior scars w/o hernias.  Genitourinary:    Comments: Stable bilaterasl soft hydroceles Musculoskeletal:        General: Normal range of motion.     Cervical back: Normal range of motion.  Skin:    General: Skin is warm.  Neurological:     General: No focal deficit present.     Mental Status: He is alert.      Assessment/Plan  Proceed with BILATERAL hydrocelectomy / orchidopexy. Risks, benefits, alternatives, expected peri-op course discussed previously and reiterated today.    Loletta Parish., MD 08/20/2023, 5:48 AM

## 2023-08-20 NOTE — Op Note (Unsigned)
NAME: Jordan [name], Jordan [name] MEDICAL RECORD NO: [id number] ACCOUNT NO: [id number] DATE OF BIRTH: 08-05-1966 FACILITY: [facility] LOCATION: [facility] PHYSICIAN: Jordan Ache, MD  Operative Report   DATE OF PROCEDURE: 08/20/2023  PREOPERATIVE DIAGNOSIS:  Right greater than left bilateral large hydroceles.  PROCEDURE: 1.  Bilateral hydrocelectomy. 2.  Bilateral orchiopexy.  ESTIMATED BLOOD LOSS:  Nil.  COMPLICATIONS:  None.  SPECIMEN:  Redundant hydrocele sac tissue and scrotal skin for discard.  FINDINGS: 1.  Right greater than left total volume approximately 470 mL hydrocele. 2.  Excessive bilateral testicular mobility following hydrocelectomy necessitating bilateral orchiopexy.  DRAINS:  Penrose drain deep in the scrotum.  INDICATIONS:  The patient is a very pleasant 57 year old man with history of prostate cancer with recent excellent biochemical control of that.  He is also status post bilateral inguinal hernia repair previously, which was quite successful in hernias  however, he developed right greater than left hydroceles, later these have slowly become more bothersome.  Options were discussed for management including observation versus  aspiration versus more definitive management with hydrocelectomy and he wished to proceed with the latter. Given the large size, it was clearly felt that concomitant orchidopexy would be warranted.  Informed consent was obtained and placed in medical record.  PROCEDURE IN DETAIL:  The patient being Jordan [name] verified and procedure being bilateral hydrocelectomy and orchidopexy confirmed.  Procedure timeout was performed.  Intravenous antibiotics administered.  General anesthesia was induced.  The patient  was placed into a supine position.  Sterile field was created, prepped and draped the patient's penis, perineum, and proximal thighs, scrotum using iodine prep after clipper shaving.  Next, an ellipsoid shape incision was made from the right to the  left  side, encompassing a large hydrocele with significant amount of excess scrotal skin.  This was carried down to the inner dartos and the area of skin was kept in continuity with the area of the right hemiscrotal compartment.  Circumferential  mobilization was performed around the right hydrocele and testis block.  This freed up so it just sized cord structures.  There was no evidence of residual hernia. Small incision was made in the hydrocele sac and drained approximately 440 mL of simple  straw-colored fluid.  Hydrocele sac was further incised, everted and the redundant hydrocele sac with en bloc, scrotal skin was separated away for discard.  It was grossly benign.  The hydrocele sac everted, edges were then reapproximated using running  Vicryl taking exquisite care to avoid inclusion of the cord structures.  Following this, completed resolution of right sided hydrocele but there was significant testicular hypermobility as orchiopexy was performed, placed in the lateral sulcus lateral  and ruling out any obvious twisting of the cord using 4-0 Prolene x2 points in the lateral aspect resulted in excellent anchoring of the right testis and the right hemiscrotal compartment.  Attention was directed to the left side, the left sided  hydrocele and testis were mobilized such that it lie just on the cord structures.  A small incision was made in the hydrocele sac.  This has drained approximately 30 mL of additional straw-colored simple fluid.  Hydrocele sac was further incised and  everted.  Remainder of the tissue removed with cautery dissection and the everted edges were reapproximated using running Vicryl and taking great care not to include the left cord structures, lateral sulcus was placed lateral and the left testis was  pexed x2 using 4-0 Prolene on the lateral edge.  There was an excellent  pexy on the left.  Additional hemostasis was achieved with point coagulation current.  Hemostasis was then  excellent.  Sponge and needle counts were correct.  Small counter incision  was made in the dependent scrotum erring to the left of midline and a quarter-inch Penrose drain was placed such that it traversed both scrotal compartments, this is anchored in place with a purposeful air knot nylon stitch.  Dartos was reapproximated  using interrupted Vicryl followed by a second running layer of Vicryl and the skin reapproximated using running Monocryl.  Procedure was terminated.  The patient tolerated the procedure well, no immediate periprocedural complications.  The patient was  taken to postanesthesia care unit in stable condition.  Plan for discharge home.   SHY D: 08/20/2023 2:56:48 pm T: 08/20/2023 7:35:00 pm  JOB: [id number]/ [id number]

## 2023-08-20 NOTE — Anesthesia Postprocedure Evaluation (Signed)
Anesthesia Post Note  Patient: Jordan [name]  Procedure(s) Performed: HYDROCELECTOMY ADULT (Bilateral) ORCHIOPEXY ADULT (Bilateral)     Patient location during evaluation: PACU Anesthesia Type: General Level of consciousness: awake and alert Pain management: pain level controlled Vital Signs Assessment: post-procedure vital signs reviewed and stable Respiratory status: spontaneous breathing, nonlabored ventilation, respiratory function stable and patient connected to nasal cannula oxygen Cardiovascular status: blood pressure returned to baseline and stable Postop Assessment: no apparent nausea or vomiting Anesthetic complications: no   No notable events documented.  Last Vitals:  Vitals:   08/20/23 1451 08/20/23 1500  BP: (!) 132/97 114/85  Pulse: 77 69  Resp: 14 11  Temp: (!) 36.4 C   SpO2: 99% 96%    Last Pain:  Vitals:   08/20/23 1500  TempSrc:   PainSc: 0-No pain                 Ames Nation

## 2023-08-20 NOTE — Anesthesia Procedure Notes (Signed)
Procedure Name: LMA Insertion Date/Time: 08/20/2023 1:45 PM  Performed by: Bishop Limbo, CRNAPre-anesthesia Checklist: Patient identified, Emergency Drugs available, Suction available and Patient being monitored Patient Re-evaluated:Patient Re-evaluated prior to induction Oxygen Delivery Method: Circle System Utilized Preoxygenation: Pre-oxygenation with 100% oxygen Induction Type: IV induction Ventilation: Mask ventilation without difficulty LMA: LMA inserted LMA Size: 5.0 Number of attempts: 1 Placement Confirmation: positive ETCO2 Tube secured with: Tape Dental Injury: Teeth and Oropharynx as per pre-operative assessment

## 2023-08-23 ENCOUNTER — Encounter (HOSPITAL_BASED_OUTPATIENT_CLINIC_OR_DEPARTMENT_OTHER): Payer: Self-pay | Admitting: Urology

## 2023-08-31 ENCOUNTER — Encounter (HOSPITAL_COMMUNITY): Payer: Self-pay

## 2023-08-31 ENCOUNTER — Other Ambulatory Visit: Payer: Self-pay

## 2023-08-31 ENCOUNTER — Emergency Department (HOSPITAL_COMMUNITY)
Admission: EM | Admit: 2023-08-31 | Discharge: 2023-08-31 | Disposition: A | Discharge status: Home / Self Care | Payer: Commercial Managed Care - PPO | Attending: Emergency Medicine | Admitting: Emergency Medicine

## 2023-08-31 DIAGNOSIS — G8918 Other acute postprocedural pain: Secondary | ICD-10-CM | POA: Insufficient documentation

## 2023-08-31 DIAGNOSIS — N501 Vascular disorders of male genital organs: Secondary | ICD-10-CM | POA: Insufficient documentation

## 2023-08-31 MED ORDER — LIDOCAINE-EPINEPHRINE-TETRACAINE (LET) TOPICAL GEL
3.0000 mL | Freq: Once | TOPICAL | Status: AC
  Administered 2023-08-31: 3 mL via TOPICAL
  Filled 2023-08-31: qty 3

## 2023-08-31 NOTE — ED Provider Notes (Signed)
EMERGENCY DEPARTMENT AT [facility] Provider Note   CSN: [id number] Arrival date & time: 08/31/23  1227     History  Chief Complaint  Patient presents with   Post-op Problem    Jordan [name] is a 57 y.o. male.  HPI Patient presents 10 days after or complexity, now with concern for bleeding from his inferior scrotum.  Patient states that he is recovering generally well, but noticed bleeding from an area on his scrotum today.  Scrotum is substantially smaller.  Or complex was done due to hydrocele with reported 400 mL of fluid.    Home Medications Prior to Admission medications   Medication Sig Start Date End Date Taking? Authorizing Provider  albuterol (PROAIR HFA) 108 (90 BASE) MCG/ACT inhaler Inhale 2 puffs into the lungs every 6 (six) hours as needed.     [provider]  fluticasone-salmeterol (WIXELA INHUB) 250-50 MCG/ACT AEPB Inhale 1 puff into the lungs in the morning and at bedtime.    [provider]  oxyCODONE-acetaminophen (PERCOCET) 5-325 MG tablet Take 1 tablet by mouth every 6 (six) hours as needed for severe pain or moderate pain (post-operatively). 08/20/23 08/19/24  Loletta Parish., MD  senna-docusate (SENOKOT-S) 8.6-50 MG tablet Take 1 tablet by mouth 2 (two) times daily. While taking strong pain meds to prevent constipation. 08/20/23   Loletta Parish., MD      Allergies    Bee pollen and Chocolate    Review of Systems   Review of Systems  All other systems reviewed and are negative.   Physical Exam Updated Vital Signs BP 119/82 (BP Location: Left Arm)   Pulse 92   Temp 98.3 F (36.8 C) (Oral)   Resp 16   Ht 5\' 9"  (1.753 m)   Wt 78 kg   SpO2 98%   BMI 25.39 kg/m  Physical Exam Vitals and nursing note reviewed. Exam conducted with a chaperone present.  Constitutional:      General: He is not in acute distress.    Appearance: He is well-developed.  HENT:     Head: Normocephalic and atraumatic.   Eyes:     Conjunctiva/sclera: Conjunctivae normal.  Cardiovascular:     Rate and Rhythm: Normal rate and regular rhythm.  Pulmonary:     Effort: Pulmonary effort is normal. No respiratory distress.     Breath sounds: No stridor.  Abdominal:     General: There is no distension.  Genitourinary:   Skin:    General: Skin is warm and dry.  Neurological:     Mental Status: He is alert and oriented to person, place, and time.     ED Results / Procedures / Treatments   Labs (all labs ordered are listed, but only abnormal results are displayed) Labs Reviewed - No data to display  EKG None  Radiology No results found.  Procedures Procedures    Medications Ordered in ED Medications  lidocaine-EPINEPHrine-tetracaine (LET) topical gel (3 mLs Topical Given 08/31/23 1350)    ED Course/ Medical Decision Making/ A&P                                 Medical Decision Making Adult male presents with scrotal bleeding, drainage the context of recent surgery.  Given the location of the wound, concern for infection versus dehiscence considered.  Patient's initial vital signs, absence of other complaints including abdominal pain, and decreased  size of his scrotum is reassuring.  Case discussed with our urology nurse practitioner, wound reviewed and the patient was seen as well. No evidence for infection, wound healing appropriate, patient received let for additional hemostasis.    Patient left prior to completion of his urology consult, but he will follow-up with the urology clinic.  Amount and/or Complexity of Data Reviewed External Data Reviewed: notes.  Risk Decision regarding hospitalization.  Final Clinical Impression(s) / ED Diagnoses Final diagnoses:  Postoperative pain  Scrotal bleeding    Rx / DC Orders ED Discharge Orders     None         Gerhard Munch, MD 08/31/23 1555

## 2023-08-31 NOTE — Discharge Instructions (Signed)
As discussed, your evaluation today has been largely reassuring.  But, it is important that you monitor your condition carefully, and do not hesitate to return to the ED if you develop new, or concerning changes in your condition. ? ?Otherwise, please follow-up with your physician for appropriate ongoing care. ? ?

## 2023-08-31 NOTE — ED Triage Notes (Signed)
Pt arrive pov states had sx Friday for hernia and reports stitches are opening and bleeding at site. Denies any other drainage. No other complaints reported

## 2023-08-31 NOTE — Consult Note (Signed)
Urology Consult Note   Requesting Attending Physician:  Gerhard Munch, MD Service Providing Consult: Urology  Consulting Attending: Dr. Marlou Porch   Reason for Consult: Assessment of postoperative incision  HPI: Jordan [name] is seen in consultation for reasons noted above at the request of Gerhard Munch, MD. and underwent bilateral orchiopexy and hydrocelectomy with Dr. Berneice Heinrich on 08/20/2023.  He presents to [facility] emergency department today out of concern of ongoing serosanguineous drainage.  On assessment patient was alert, oriented, no distress.  He had no outward signs of systemic infection and wound appeared as expected this early postoperatively.  ------------------  Assessment:  57 y.o. male status post orchiopexy and hydrocelectomy   Recommendations:  #Bilateral hydrocele I went to see Jordan [name] in the emergency department.  His incision was intact with no dehiscence.  There was no purulence, erythema, or foul odors.  No fluctuance was noted.  There is a minor amount of erosion at the right lateral base of the scrotum, likely secondary to sitting in wet briefs for a week.  Drainage was largely serous with minimal blood noted in incontinence brief.     Patient is safe to discharge home from a urologic perspective.  Requested that he keep his incision clean and dry, frequently changing pads as needed.  Close follow-up in clinic.  Case and plan discussed with Dr. Marlou Porch and Dr. Jeraldine Loots  Past Medical History: Past Medical History:  Diagnosis Date   Asthma, well controlled, moderate persistent    per pt followed by pcp, dr polite, stated controlled w/ symbicort inhaler (as of 11-22-2017 denies upper airway infection in past month) (in epic documented pumoloigst , dr Sherene Sires , Theron Arista 2012)   Prostate cancer [facility]) urologist-  dr Berneice Heinrich  oncologist-  dr Kathrynn Running   dx 08-16-2017 (bx)-- Stage T1c, Gleason 3+4, PSA 60.3, vol 38cc-- scheduled for radiactive seed implants  11-25-2017    Past Surgical History:  Past Surgical History:  Procedure Laterality Date   CYSTOSCOPY N/A 11/25/2017   Procedure: CYSTOSCOPY;  Surgeon: Sebastian Ache, MD;  Location: [facility];  Service: Urology;  Laterality: N/A;  No seeds noted in bladder   HYDROCELE EXCISION Bilateral 08/20/2023   Procedure: HYDROCELECTOMY ADULT;  Surgeon: Loletta Parish., MD;  Location: [facility];  Service: Urology;  Laterality: Bilateral;  1 HR   INSERTION OF MESH Bilateral 11/19/2020   Procedure: INSERTION OF MESH;  Surgeon: Axel Filler, MD;  Location: [facility] OR;  Service: General;  Laterality: Bilateral;   ORCHIOPEXY Bilateral 08/20/2023   Procedure: ORCHIOPEXY ADULT;  Surgeon: Loletta Parish., MD;  Location[facility];  Service: Urology;  Laterality: Bilateral;   PROSTATE BIOPSY  08-16-2017  office   RADIOACTIVE SEED IMPLANT N/A 11/25/2017   Procedure: RADIOACTIVE SEED IMPLANT/BRACHYTHERAPY IMPLANT;  Surgeon: Sebastian Ache, MD;  Location: [facility];  Service: Urology;  Laterality: N/A;   seeds implanted   SPACE OAR INSTILLATION N/A 11/25/2017   Procedure: SPACE OAR INSTILLATION;  Surgeon: Sebastian Ache, MD;  Location: [facility];  Service: Urology;  Laterality: N/A;   XI ROBOTIC ASSISTED INGUINAL HERNIA REPAIR WITH MESH Bilateral 11/19/2020   Procedure: XI ROBOTIC ASSISTED BILATERAL INGUINAL HERNIA REPAIR WITH MESH;  Surgeon: Axel Filler, MD;  Location: [facility] OR;  Service: General;  Laterality: Bilateral;    Medication: No current facility-administered medications for this encounter.   Current Outpatient Medications  Medication Sig Dispense Refill   albuterol (PROAIR HFA) 108 (90  BASE) MCG/ACT inhaler Inhale 2 puffs into the lungs every 6 (six) hours as needed.      fluticasone-salmeterol (WIXELA INHUB) 250-50 MCG/ACT AEPB Inhale 1 puff into the lungs in the morning and at bedtime.      oxyCODONE-acetaminophen (PERCOCET) 5-325 MG tablet Take 1 tablet by mouth every 6 (six) hours as needed for severe pain or moderate pain (post-operatively). 15 tablet 0   senna-docusate (SENOKOT-S) 8.6-50 MG tablet Take 1 tablet by mouth 2 (two) times daily. While taking strong pain meds to prevent constipation. 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   Vaping status: Never Used  Substance Use Topics   Alcohol use: Not Currently   Drug use: No    Family History Family History  Problem Relation Age of Onset   Asthma Maternal Aunt    Cancer Mother        breast   Cancer Father        prostate   Cancer Sister        unknown/"cancer of blood"   Cancer Brother        prostate   Cancer Maternal Uncle        prostate   Cancer Paternal Aunt        unknown   Cancer Cousin        lung    Review of Systems  Genitourinary:        Postoperative drainage, serosanguineous     Objective   Vital signs in last 24 hours: BP 119/82 (BP Location: Left Arm)   Pulse 92   Temp 98.3 F (36.8 C) (Oral)   Resp 16   Ht 5\' 9"  (1.753 m)   Wt 78 kg   SpO2 98%   BMI 25.39 kg/m   Physical Exam General: NAD, A&O, resting, appropriate HEENT: [place]/AT Pulmonary: Normal work of breathing Cardiovascular:  no cyanosis GU: Large lateral scrotal incision with some serosanguineous drainage. Neuro: Appropriate, no focal neurological deficits  Most Recent Labs: Lab Results  Component Value Date   WBC 8.7 11/11/2020   HGB 14.2 11/11/2020   HCT 44.3 11/11/2020   PLT 363 11/11/2020    Lab Results  Component Value Date   NA 137 11/22/2017   K 4.3 11/22/2017   CL 106 11/22/2017   CO2 25 11/22/2017   BUN 18 11/22/2017   CREATININE 1.43 (H)  11/22/2017   CALCIUM 9.2 11/22/2017    Lab Results  Component Value Date   INR 0.95 11/22/2017   APTT 26 11/22/2017     Urine Culture: @[id number] (laburin,org,[id number],[id number])@   IMAGING: No results found.  ------  Elmon Kirschner, NP Pager: [phone]   Please contact the urology consult pager with any further questions/concerns.
